# Patient Record
Sex: Female | Born: 1965 | Race: White | Hispanic: No | Marital: Single | Smoking: Never smoker
Health system: Southern US, Community
[De-identification: ages and names within clinical notes are randomized; demographics above are authoritative.]

## PROBLEM LIST (undated history)

## (undated) DIAGNOSIS — I471 Supraventricular tachycardia, unspecified: Secondary | ICD-10-CM

## (undated) DIAGNOSIS — R768 Other specified abnormal immunological findings in serum: Secondary | ICD-10-CM

## (undated) DIAGNOSIS — M1990 Unspecified osteoarthritis, unspecified site: Secondary | ICD-10-CM

## (undated) DIAGNOSIS — I341 Nonrheumatic mitral (valve) prolapse: Secondary | ICD-10-CM

## (undated) DIAGNOSIS — R000 Tachycardia, unspecified: Secondary | ICD-10-CM

## (undated) DIAGNOSIS — I10 Essential (primary) hypertension: Secondary | ICD-10-CM

## (undated) DIAGNOSIS — A4902 Methicillin resistant Staphylococcus aureus infection, unspecified site: Secondary | ICD-10-CM

## (undated) DIAGNOSIS — R7689 Other specified abnormal immunological findings in serum: Secondary | ICD-10-CM

## (undated) DIAGNOSIS — I499 Cardiac arrhythmia, unspecified: Secondary | ICD-10-CM

---

## 2006-04-28 ENCOUNTER — Emergency Department: Payer: Self-pay | Admitting: Unknown Physician Specialty

## 2008-01-15 ENCOUNTER — Emergency Department: Payer: Self-pay | Admitting: Emergency Medicine

## 2008-04-05 ENCOUNTER — Emergency Department: Payer: Self-pay | Admitting: Internal Medicine

## 2009-01-13 ENCOUNTER — Emergency Department: Payer: Self-pay | Admitting: Emergency Medicine

## 2012-03-12 ENCOUNTER — Emergency Department: Payer: Self-pay | Admitting: *Deleted

## 2012-03-12 LAB — URINALYSIS, COMPLETE
Bilirubin,UR: NEGATIVE
Leukocyte Esterase: NEGATIVE
Ph: 6 (ref 4.5–8.0)
Protein: NEGATIVE
RBC,UR: 11 /HPF (ref 0–5)
Specific Gravity: 1.021 (ref 1.003–1.030)
Squamous Epithelial: 1
WBC UR: 1 /HPF (ref 0–5)

## 2012-03-12 LAB — CBC
HGB: 12.1 g/dL (ref 12.0–16.0)
MCH: 27.6 pg (ref 26.0–34.0)
MCHC: 32.5 g/dL (ref 32.0–36.0)
Platelet: 290 10*3/uL (ref 150–440)
RDW: 14 % (ref 11.5–14.5)

## 2012-03-12 LAB — COMPREHENSIVE METABOLIC PANEL
Albumin: 3.7 g/dL (ref 3.4–5.0)
Alkaline Phosphatase: 61 U/L (ref 50–136)
Anion Gap: 8 (ref 7–16)
BUN: 14 mg/dL (ref 7–18)
Calcium, Total: 8.2 mg/dL — ABNORMAL LOW (ref 8.5–10.1)
EGFR (African American): >60
Glucose: 87 mg/dL (ref 65–99)
Osmolality: 277 (ref 275–301)
Potassium: 3.5 mmol/L (ref 3.5–5.1)
Sodium: 139 mmol/L (ref 136–145)

## 2012-03-12 LAB — TROPONIN I: Troponin-I: <0.02 ng/mL

## 2013-06-18 ENCOUNTER — Ambulatory Visit: Payer: Self-pay

## 2014-11-18 ENCOUNTER — Ambulatory Visit: Payer: Self-pay

## 2014-12-28 ENCOUNTER — Emergency Department
Admit: 2014-12-28 | Disposition: A | Discharge status: Home / Self Care | Payer: Self-pay | Admitting: Emergency Medicine

## 2015-07-06 ENCOUNTER — Ambulatory Visit
Admission: EM | Admit: 2015-07-06 | Discharge: 2015-07-06 | Disposition: A | Discharge status: Home / Self Care | Payer: BLUE CROSS/BLUE SHIELD | Attending: Family Medicine | Admitting: Family Medicine

## 2015-07-06 ENCOUNTER — Encounter: Payer: Self-pay | Admitting: Gynecology

## 2015-07-06 DIAGNOSIS — L02411 Cutaneous abscess of right axilla: Secondary | ICD-10-CM | POA: Diagnosis not present

## 2015-07-06 HISTORY — DX: Tachycardia, unspecified: R00.0

## 2015-07-06 HISTORY — DX: Methicillin resistant Staphylococcus aureus infection, unspecified site: A49.02

## 2015-07-06 HISTORY — DX: Nonrheumatic mitral (valve) prolapse: I34.1

## 2015-07-06 MED ORDER — SULFAMETHOXAZOLE-TRIMETHOPRIM 800-160 MG PO TABS: 1.0000 | ORAL_TABLET | Freq: Two times a day (BID) | ORAL | Status: DC

## 2015-07-06 NOTE — ED Provider Notes (Signed)
CSN: [id number]     Arrival date & time 07/06/15  1043 History   First MD Initiated Contact with Patient 07/06/15 1119     Chief Complaint  Patient presents with  . Cyst   (Consider location/radiation/quality/duration/timing/severity/associated sxs/prior Treatment) HPI Comments: 49 yo female with a 5 days h/o progressively worsening right axilla skin lump. States last night she noticed a very slight amount of pus drain out.   The history is provided by the patient.    Past Medical History  Diagnosis Date  . Mitral valve prolapse   . Tachycardia    History reviewed. No pertinent past surgical history. No family history on file. Social History  Substance Use Topics  . Smoking status: Never Smoker   . Smokeless tobacco: None  . Alcohol Use: No   OB History    No data available     Review of Systems  Allergies  Codeine  Home Medications   Prior to Admission medications   Medication Sig Start Date End Date Taking? Authorizing Provider  atenolol (TENORMIN) 50 MG tablet Take 50 mg by mouth daily.   Yes Historical Provider, MD  sulfamethoxazole-trimethoprim (BACTRIM DS,SEPTRA DS) 800-160 MG tablet Take 1 tablet by mouth 2 (two) times daily. 07/06/15   Payton Mccallumrlando Gladie Gravette, MD   Meds Ordered and Administered this Visit  Medications - No data to display  BP 121/63 mmHg  Pulse 76  Temp(Src) 98.9 F (37.2 C) (Oral)  Resp 16  Ht 5\' 4"  (1.626 m)  Wt 170 lb (77.111 kg)  BMI 29.17 kg/m2  SpO2 100%  LMP 05/31/2015 No data found.   Physical Exam  Constitutional: She appears well-developed and well-nourished. No distress.  Skin: She is not diaphoretic.  4x2cm erythematous, fluctuant and tender subcutanesous lump with white appearing point in the middle on the right axillary skin  Nursing note and vitals reviewed.   ED Course  Procedures (including critical care time)  Labs Review Labs Reviewed  CULTURE, ROUTINE-ABSCESS    Imaging Review No results found.   Visual  Acuity Review  Right Eye Distance:   Left Eye Distance:   Bilateral Distance:    Right Eye Near:   Left Eye Near:    Bilateral Near:         MDM   1. Abscess of axilla, right    Discharge Medication List as of 07/06/2015 11:53 AM    START taking these medications   Details  sulfamethoxazole-trimethoprim (BACTRIM DS,SEPTRA DS) 800-160 MG tablet Take 1 tablet by mouth 2 (two) times daily., Starting 07/06/2015, Until Discontinued, Normal      1.diagnosis reviewed with patient; discussed I&D procedure and patient states she would only like to have the surface nicked to open but not deeper and would not like injection of lidocaine or antibiotic; surface cleaned and skin surface nicked with 11 blade with expression/drainage of pus; obtained sample for culture; patient tolerated well. 2. rx as per orders above; reviewed possible side effects, interactions, risks and benefits  3. Recommend supportive treatment with warm compresses to area 4. Follow up prn if symptoms worsen or don't improve   Payton Mccallumrlando Alanys Godino, MD 07/06/15 [phone]

## 2015-07-06 NOTE — ED Notes (Signed)
Patient c/o painful lump under right axillary x 5 days.

## 2015-07-08 ENCOUNTER — Telehealth: Payer: Self-pay

## 2015-07-08 NOTE — ED Notes (Signed)
Lab call (+) MRSA. Pt currently prescribed Bactrim.

## 2015-07-09 LAB — CULTURE, ROUTINE-ABSCESS: Special Requests: NORMAL

## 2015-07-11 NOTE — ED Notes (Signed)
MRSA positive abscess treated w sulfa based Rx; treatment adequate w Rx provided day of visit. Left message for patient to be rechecked if she is not improving and/or if she has drain that is to be removed.

## 2015-07-14 NOTE — ED Notes (Signed)
Received message, called patient and left message to call if she continues to have questions

## 2016-05-10 DIAGNOSIS — I341 Nonrheumatic mitral (valve) prolapse: Secondary | ICD-10-CM | POA: Insufficient documentation

## 2016-05-10 DIAGNOSIS — I471 Supraventricular tachycardia: Secondary | ICD-10-CM | POA: Insufficient documentation

## 2016-07-22 ENCOUNTER — Other Ambulatory Visit: Payer: Self-pay | Admitting: Medical Oncology

## 2016-07-22 ENCOUNTER — Telehealth: Payer: Self-pay | Admitting: Surgery

## 2016-07-22 DIAGNOSIS — R599 Enlarged lymph nodes, unspecified: Secondary | ICD-10-CM

## 2016-07-22 NOTE — Telephone Encounter (Signed)
Left voice message for patient to call schedule appointment for enlarged lymph node. [facility]

## 2016-07-26 ENCOUNTER — Other Ambulatory Visit: Payer: Self-pay | Admitting: Medical Oncology

## 2016-07-26 DIAGNOSIS — R599 Enlarged lymph nodes, unspecified: Secondary | ICD-10-CM

## 2016-07-27 ENCOUNTER — Other Ambulatory Visit: Payer: Self-pay

## 2016-07-27 ENCOUNTER — Ambulatory Visit
Admission: RE | Admit: 2016-07-27 | Discharge: 2016-07-27 | Disposition: A | Discharge status: Home / Self Care | Payer: BLUE CROSS/BLUE SHIELD | Source: Ambulatory Visit | Attending: Medical Oncology | Admitting: Medical Oncology

## 2016-07-27 DIAGNOSIS — R599 Enlarged lymph nodes, unspecified: Secondary | ICD-10-CM | POA: Diagnosis not present

## 2016-07-29 ENCOUNTER — Telehealth: Payer: Self-pay | Admitting: *Deleted

## 2016-07-29 NOTE — Telephone Encounter (Signed)
Patient called and stated [facility] and [facility] was [facility]. She was coming in to see Dr. Everlene FarrierPabon today . Patient is unsure if she still needs to see him if her Ultrasound results was normal and she didn't need to f/u with her dr there for 3-6 months . Patient states you can call her if she still needs to come in and see the Surgeon. Her contact number is [phone]. Please advise provider. Thank you

## 2016-07-30 ENCOUNTER — Ambulatory Visit: Payer: BLUE CROSS/BLUE SHIELD | Admitting: Surgery

## 2016-08-04 ENCOUNTER — Other Ambulatory Visit: Payer: Self-pay | Admitting: Medical Oncology

## 2016-08-04 DIAGNOSIS — R590 Localized enlarged lymph nodes: Secondary | ICD-10-CM

## 2016-12-15 ENCOUNTER — Other Ambulatory Visit: Payer: Self-pay | Admitting: Medical Oncology

## 2016-12-15 DIAGNOSIS — Z1231 Encounter for screening mammogram for malignant neoplasm of breast: Secondary | ICD-10-CM

## 2017-01-10 ENCOUNTER — Ambulatory Visit
Admission: RE | Admit: 2017-01-10 | Discharge: 2017-01-10 | Disposition: A | Discharge status: Home / Self Care | Payer: BLUE CROSS/BLUE SHIELD | Source: Ambulatory Visit | Attending: Medical Oncology | Admitting: Medical Oncology

## 2017-01-10 DIAGNOSIS — Z1231 Encounter for screening mammogram for malignant neoplasm of breast: Secondary | ICD-10-CM | POA: Insufficient documentation

## 2017-01-10 DIAGNOSIS — R928 Other abnormal and inconclusive findings on diagnostic imaging of breast: Secondary | ICD-10-CM | POA: Diagnosis not present

## 2017-01-14 ENCOUNTER — Other Ambulatory Visit: Payer: Self-pay | Admitting: Medical Oncology

## 2017-01-14 DIAGNOSIS — R928 Other abnormal and inconclusive findings on diagnostic imaging of breast: Secondary | ICD-10-CM

## 2017-01-14 DIAGNOSIS — N6489 Other specified disorders of breast: Secondary | ICD-10-CM

## 2017-01-20 ENCOUNTER — Ambulatory Visit
Admission: RE | Admit: 2017-01-20 | Discharge: 2017-01-20 | Disposition: A | Discharge status: Home / Self Care | Payer: BLUE CROSS/BLUE SHIELD | Source: Ambulatory Visit | Attending: Medical Oncology | Admitting: Medical Oncology

## 2017-01-20 DIAGNOSIS — R928 Other abnormal and inconclusive findings on diagnostic imaging of breast: Secondary | ICD-10-CM

## 2017-01-20 DIAGNOSIS — N6489 Other specified disorders of breast: Secondary | ICD-10-CM | POA: Diagnosis present

## 2017-06-10 ENCOUNTER — Other Ambulatory Visit: Payer: Self-pay | Admitting: Medical Oncology

## 2017-06-10 DIAGNOSIS — R928 Other abnormal and inconclusive findings on diagnostic imaging of breast: Secondary | ICD-10-CM

## 2017-07-25 ENCOUNTER — Ambulatory Visit
Admission: RE | Admit: 2017-07-25 | Discharge: 2017-07-25 | Disposition: A | Discharge status: Home / Self Care | Payer: BLUE CROSS/BLUE SHIELD | Source: Ambulatory Visit | Attending: Medical Oncology | Admitting: Medical Oncology

## 2017-07-25 DIAGNOSIS — R928 Other abnormal and inconclusive findings on diagnostic imaging of breast: Secondary | ICD-10-CM | POA: Insufficient documentation

## 2017-08-10 ENCOUNTER — Encounter: Payer: Self-pay | Admitting: *Deleted

## 2017-08-15 ENCOUNTER — Encounter: Payer: Self-pay | Admitting: *Deleted

## 2017-08-15 ENCOUNTER — Ambulatory Visit
Admission: RE | Admit: 2017-08-15 | Discharge: 2017-08-15 | Disposition: A | Discharge status: Home / Self Care | Payer: BLUE CROSS/BLUE SHIELD | Source: Ambulatory Visit | Attending: Gastroenterology | Admitting: Gastroenterology

## 2017-08-15 ENCOUNTER — Ambulatory Visit: Payer: BLUE CROSS/BLUE SHIELD | Admitting: Anesthesiology

## 2017-08-15 ENCOUNTER — Encounter
Admission: RE | Disposition: A | Discharge status: Home / Self Care | Payer: Self-pay | Source: Ambulatory Visit | Attending: Gastroenterology

## 2017-08-15 DIAGNOSIS — Z8614 Personal history of Methicillin resistant Staphylococcus aureus infection: Secondary | ICD-10-CM | POA: Insufficient documentation

## 2017-08-15 DIAGNOSIS — Z1211 Encounter for screening for malignant neoplasm of colon: Secondary | ICD-10-CM | POA: Insufficient documentation

## 2017-08-15 DIAGNOSIS — I471 Supraventricular tachycardia: Secondary | ICD-10-CM | POA: Insufficient documentation

## 2017-08-15 DIAGNOSIS — I341 Nonrheumatic mitral (valve) prolapse: Secondary | ICD-10-CM | POA: Diagnosis not present

## 2017-08-15 DIAGNOSIS — Z800 Family history of malignant neoplasm of digestive organs: Secondary | ICD-10-CM | POA: Insufficient documentation

## 2017-08-15 DIAGNOSIS — Z8371 Family history of colonic polyps: Secondary | ICD-10-CM | POA: Diagnosis not present

## 2017-08-15 DIAGNOSIS — K635 Polyp of colon: Secondary | ICD-10-CM | POA: Insufficient documentation

## 2017-08-15 DIAGNOSIS — K648 Other hemorrhoids: Secondary | ICD-10-CM | POA: Diagnosis not present

## 2017-08-15 HISTORY — DX: Cardiac arrhythmia, unspecified: I49.9

## 2017-08-15 HISTORY — PX: COLONOSCOPY WITH PROPOFOL: SHX5780

## 2017-08-15 LAB — POCT PREGNANCY, URINE: PREG TEST UR: NEGATIVE

## 2017-08-15 SURGERY — COLONOSCOPY WITH PROPOFOL
Anesthesia: General

## 2017-08-15 MED ORDER — MIDAZOLAM HCL 2 MG/2ML IJ SOLN
INTRAMUSCULAR | Status: DC | PRN
  Administered 2017-08-15: 2 mg via INTRAVENOUS

## 2017-08-15 MED ORDER — SODIUM CHLORIDE 0.9 % IV SOLN: INTRAVENOUS | Status: DC

## 2017-08-15 MED ORDER — SODIUM CHLORIDE 0.9 % IV SOLN
INTRAVENOUS | Status: DC
  Administered 2017-08-15: 1000 mL via INTRAVENOUS

## 2017-08-15 MED ORDER — FENTANYL CITRATE (PF) 100 MCG/2ML IJ SOLN
INTRAMUSCULAR | Status: AC
  Filled 2017-08-15: qty 2

## 2017-08-15 MED ORDER — FENTANYL CITRATE (PF) 100 MCG/2ML IJ SOLN
INTRAMUSCULAR | Status: DC | PRN
  Administered 2017-08-15: 25 ug via INTRAVENOUS
  Administered 2017-08-15: 50 ug via INTRAVENOUS
  Administered 2017-08-15: 25 ug via INTRAVENOUS

## 2017-08-15 MED ORDER — PROPOFOL 500 MG/50ML IV EMUL
INTRAVENOUS | Status: AC
  Filled 2017-08-15: qty 50

## 2017-08-15 MED ORDER — PROPOFOL 10 MG/ML IV BOLUS
INTRAVENOUS | Status: AC
  Filled 2017-08-15: qty 20

## 2017-08-15 MED ORDER — MIDAZOLAM HCL 2 MG/2ML IJ SOLN
INTRAMUSCULAR | Status: AC
  Filled 2017-08-15: qty 2

## 2017-08-15 MED ORDER — PROPOFOL 500 MG/50ML IV EMUL
INTRAVENOUS | Status: DC | PRN
  Administered 2017-08-15: 120 ug/kg/min via INTRAVENOUS

## 2017-08-15 NOTE — H&P (Signed)
Outpatient short stay form Pre-procedure 08/15/2017 3:48 PM Carrie Nichols U Carrie Montini MD  Primary Physician: Porfirio OarSarah Michelle Nelson PA  Reason for visit:  Colonoscopy  History of present illness:  Patient is a 51 year old female presenting today as above. There is a family history of colon cancer in multiple secondary relatives and colon polyps and a primary relative. She tolerated her prep well. She takes no aspirin or blood thinning agents. Her last colonoscopy was over 10 years ago.    Current Facility-Administered Medications:  .  0.9 %  sodium chloride infusion, , Intravenous, Continuous, Carrie DeemSkulskie, Terryl Niziolek U, MD, Last Rate: 20 mL/hr at 08/15/17 1326, 1,000 mL at 08/15/17 1326 .  0.9 %  sodium chloride infusion, , Intravenous, Continuous, Carrie DeemSkulskie, Blair Lundeen U, MD .  0.9 %  sodium chloride infusion, , Intravenous, Continuous, Carrie DeemSkulskie, Shemar Plemmons U, MD  Medications Prior to Admission  Medication Sig Dispense Refill Last Dose  . ibuprofen (ADVIL,MOTRIN) 200 MG tablet Take 400 mg by mouth as needed.   Past Week at Unknown time  . metoprolol succinate (TOPROL-XL) 25 MG 24 hr tablet Take 1 tablet by mouth 1 day or 1 dose.   08/15/2017 at 700     Allergies  Allergen Reactions  . Codeine Rash     Past Medical History:  Diagnosis Date  . Dysrhythmia    SVT  . Mitral valve prolapse   . MRSA infection   . Tachycardia     Review of systems:      Physical Exam    Heart and lungs: Regular rate and rhythm without rub or gallop, lungs are bilaterally clear    HEENT: Normocephalic atraumatic eyes are anicteric    Other:     Pertinant exam for procedure: Soft nontender nondistended bowel sounds positive normoactive.    Planned proceedures: Colonoscopy and indicated procedures. I have discussed the risks benefits and complications of procedures to include not limited to bleeding, infection, perforation and the risk of sedation and the patient wishes to proceed.    Carrie Nichols U Mikella Linsley,  MD Gastroenterology 08/15/2017  3:48 PM

## 2017-08-15 NOTE — Op Note (Signed)
[facility] Gastroenterology Patient Name: Carrie Nichols Procedure Date: 08/15/2017 3:55 PM MRN: [id number] Account #: [id number] Date of Birth: Jan 21, 1966 Admit Type: Outpatient Age: 51 [facility] ROOM 1 Gender: Female Note Status: Finalized Procedure:            Colonoscopy Indications:          Family history of colon cancer in multiple                        second-degree relatives, Family history of colonic                        polyps in a first-degree relative Providers:            Christena DeemMartin U. Dalan Cowger, MD Referring MD:         Lynett GrimesSarah M. Nelson (Referring MD) Medicines:            Monitored Anesthesia Care Complications:        No immediate complications. Procedure:            Pre-Anesthesia Assessment:                       - ASA Grade Assessment: II - A patient with mild                        systemic disease.                       After obtaining informed consent, the colonoscope was                        passed under direct vision. Throughout the procedure,                        the patient's blood pressure, pulse, and oxygen                        saturations were monitored continuously. The                        Colonoscope was introduced through the anus and                        advanced to the the cecum, identified by appendiceal                        orifice and ileocecal valve. The colonoscopy was                        unusually difficult due to poor bowel prep. Successful                        completion of the procedure was aided by lavage. The                        quality of the bowel preparation was good except the                        ascending colon was poor. Findings:      Six sessile polyps were found in the recto-sigmoid colon. The polyps  were 1 to 3 mm in size.      The exam was otherwise normal throughout the examined colon.      Retroflexion in the rectum was attempted though not completed due to   narrowness of the vault, however multiple passes showed no rectal       abnormality except small internal hemorrhoids.      The digital rectal exam was normal. Impression:           - Six 1 to 3 mm polyps at the recto-sigmoid colon.                       - No specimens collected. Recommendation:       - Discharge patient to home.                       - Await pathology results.                       - Telephone GI clinic for pathology results in 1 week. Procedure Code(s):    --- Professional ---                       [phone], Colonoscopy, flexible; diagnostic, including                        collection of specimen(s) by brushing or washing, when                        performed (separate procedure) Diagnosis Code(s):    --- Professional ---                       D12.7, Benign neoplasm of rectosigmoid junction                       Z80.0, Family history of malignant neoplasm of                        digestive organs                       Z83.71, Family history of colonic polyps CPT copyright 2016 American [employer]. All rights reserved. The codes documented in this report are preliminary and upon coder review may  be revised to meet current compliance requirements. Christena DeemMartin U Rosalee Tolley, MD 08/15/2017 4:32:31 PM This report has been signed electronically. Number of Addenda: 0 Note Initiated On: 08/15/2017 3:55 PM Scope Withdrawal Time: 0 hours 14 minutes 46 seconds  Total Procedure Duration: 0 hours 23 minutes 57 seconds       [facility]

## 2017-08-15 NOTE — Anesthesia Procedure Notes (Signed)
Performed by: Cook-Martin, Inika Bellanger Pre-anesthesia Checklist: Patient identified, Emergency Drugs available, Suction available, Patient being monitored and Timeout performed Patient Re-evaluated:Patient Re-evaluated prior to induction Oxygen Delivery Method: Nasal cannula Preoxygenation: Pre-oxygenation with 100% oxygen Induction Type: IV induction Placement Confirmation: positive ETCO2 and CO2 detector       

## 2017-08-15 NOTE — Transfer of Care (Signed)
Immediate Anesthesia Transfer of Care Note  Patient: Carrie Nichols  Procedure(s) Performed: COLONOSCOPY WITH PROPOFOL (N/A )  Patient Location: PACU  Anesthesia Type:General  Level of Consciousness: awake and sedated  Airway & Oxygen Therapy: Patient Spontanous Breathing and Patient connected to nasal cannula oxygen  Post-op Assessment: Report given to RN and Post -op Vital signs reviewed and stable  Post vital signs: stable Last Vitals:  Vitals:   08/15/17 1304  BP: 138/74  Pulse: 80  Resp: 20  Temp: 36.9 C  SpO2: 99%    Last Pain:  Vitals:   08/15/17 1304  TempSrc: Tympanic      Patients Stated Pain Goal: 0 (08/15/17 1304)  Complications: No apparent anesthesia complications

## 2017-08-15 NOTE — Anesthesia Post-op Follow-up Note (Signed)
Anesthesia QCDR form completed.        

## 2017-08-15 NOTE — Anesthesia Preprocedure Evaluation (Signed)
Anesthesia Evaluation    Reviewed: Allergy & Precautions, NPO status , Patient's Chart, lab work & pertinent test results, reviewed documented beta blocker date and time   History of Anesthesia Complications Negative for: history of anesthetic complications  Airway Mallampati: II       Dental   Pulmonary neg sleep apnea, neg COPD,           Cardiovascular + dysrhythmias Supra Ventricular Tachycardia + Valvular Problems/Murmurs MVP      Neuro/Psych neg Seizures    GI/Hepatic Neg liver ROS, neg GERD  ,  Endo/Other  neg diabetes  Renal/GU negative Renal ROS     Musculoskeletal   Abdominal   Peds  Hematology   Anesthesia Other Findings   Reproductive/Obstetrics                             Anesthesia Physical Anesthesia Plan  ASA: II  Anesthesia Plan: General   Post-op Pain Management:    Induction:   PONV Risk Score and Plan: 2 and Ondansetron, Propofol infusion and TIVA  Airway Management Planned: Nasal Cannula  Additional Equipment:   Intra-op Plan:   Post-operative Plan:   Informed Consent: I have reviewed the patients History and Physical, chart, labs and discussed the procedure including the risks, benefits and alternatives for the proposed anesthesia with the patient or authorized representative who has indicated his/her understanding and acceptance.     Plan Discussed with:   Anesthesia Plan Comments:         Anesthesia Quick Evaluation

## 2017-08-15 NOTE — Anesthesia Postprocedure Evaluation (Signed)
Anesthesia Post Note  Patient: Carrie HugueninCarolyn Faye Nichols  Procedure(s) Performed: COLONOSCOPY WITH PROPOFOL (N/A )  Patient location during evaluation: Endoscopy Anesthesia Type: General Level of consciousness: awake and alert Pain management: pain level controlled Vital Signs Assessment: post-procedure vital signs reviewed and stable Respiratory status: spontaneous breathing and respiratory function stable Cardiovascular status: stable Anesthetic complications: no     Last Vitals:  Vitals:   08/15/17 1304 08/15/17 1633  BP: 138/74 113/72  Pulse: 80   Resp: 20   Temp: 36.9 C (!) 36.1 C  SpO2: 99%     Last Pain:  Vitals:   08/15/17 1633  TempSrc: Tympanic                 Zayn Selley K

## 2017-08-16 ENCOUNTER — Encounter: Payer: Self-pay | Admitting: Gastroenterology

## 2017-08-18 LAB — SURGICAL PATHOLOGY

## 2018-01-18 ENCOUNTER — Other Ambulatory Visit: Payer: Self-pay | Admitting: Medical Oncology

## 2018-01-18 DIAGNOSIS — R928 Other abnormal and inconclusive findings on diagnostic imaging of breast: Secondary | ICD-10-CM

## 2018-02-16 ENCOUNTER — Ambulatory Visit
Admission: RE | Admit: 2018-02-16 | Discharge: 2018-02-16 | Disposition: A | Discharge status: Home / Self Care | Payer: BLUE CROSS/BLUE SHIELD | Source: Ambulatory Visit | Attending: Medical Oncology | Admitting: Medical Oncology

## 2018-02-16 DIAGNOSIS — R928 Other abnormal and inconclusive findings on diagnostic imaging of breast: Secondary | ICD-10-CM

## 2018-07-04 IMAGING — MG MM DIGITAL SCREENING BILAT W/ CAD
8 series · 8 of 8 positions shown · non-contrast
Comparison: Previous exam(s).

CLINICAL DATA: Screening.

EXAM:
DIGITAL SCREENING BILATERAL MAMMOGRAM WITH CAD

[R MLO (1 of 2)]
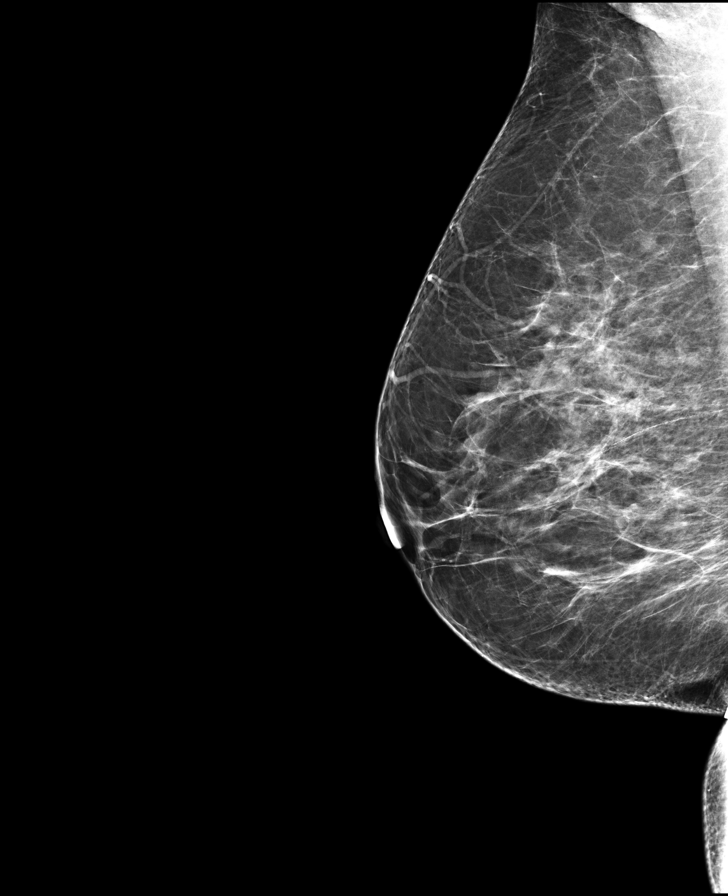

[R XCCL]
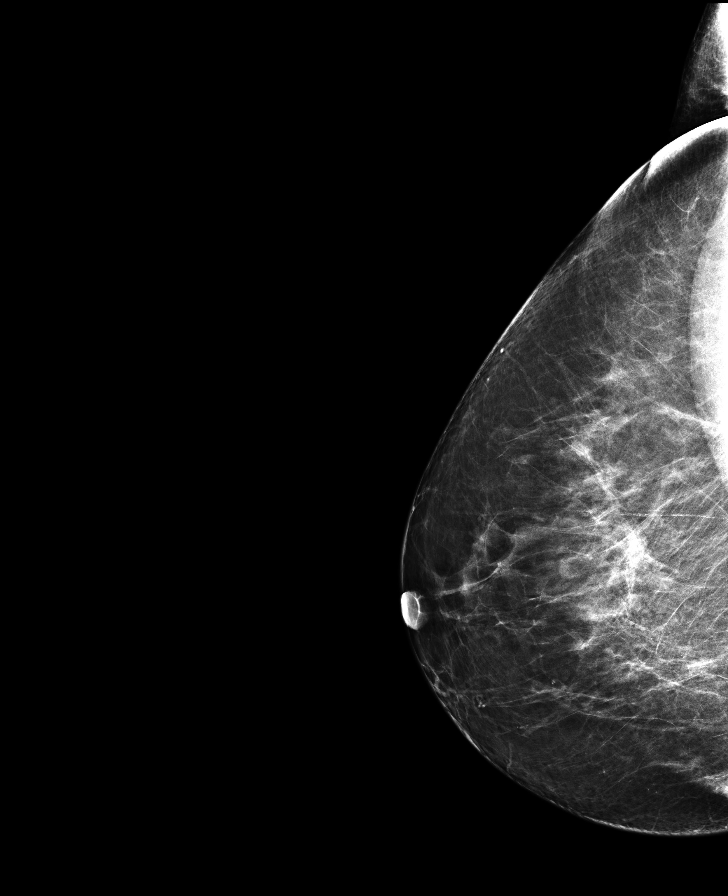

[L MLO (1 of 2)]
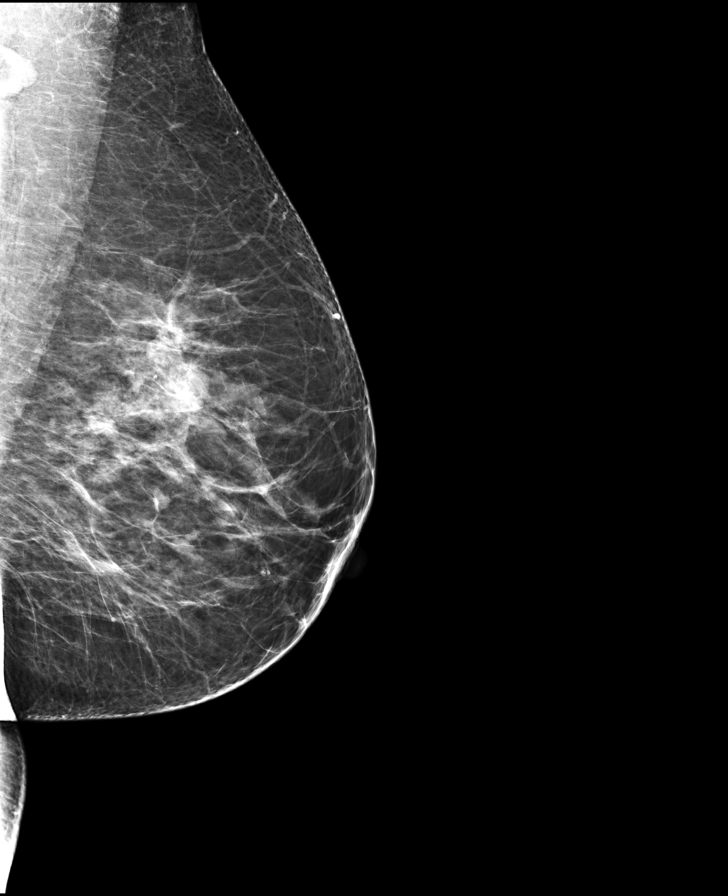

[L CC (1 of 2)]
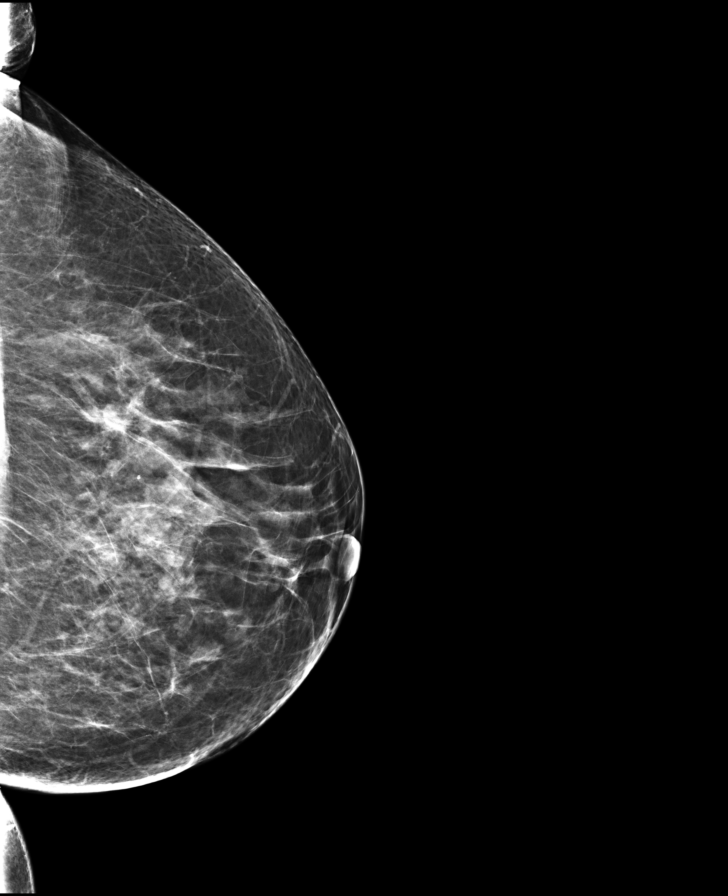

[R CC]
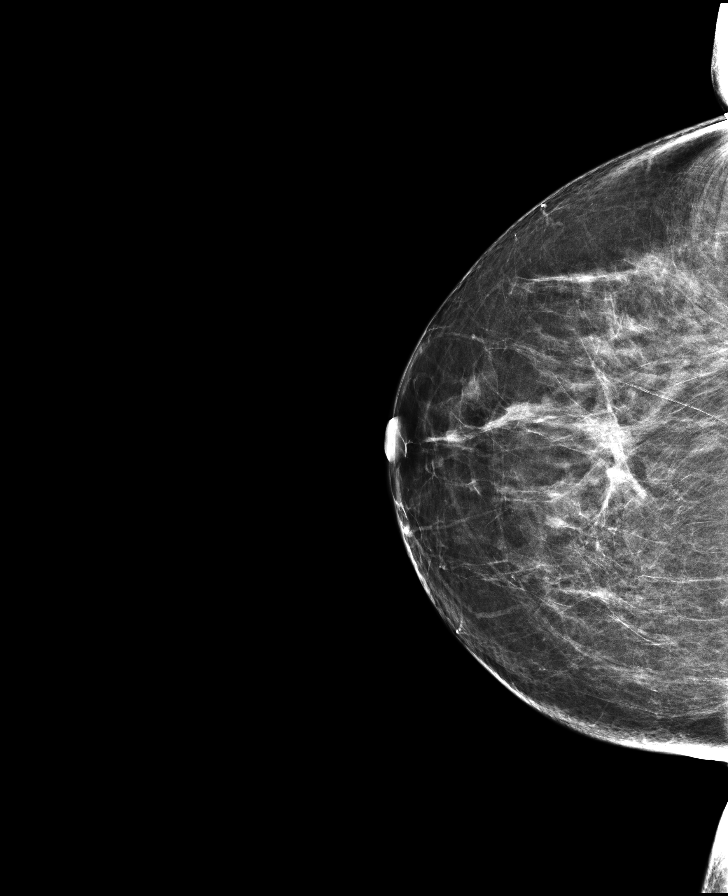

[R MLO (2 of 2)]
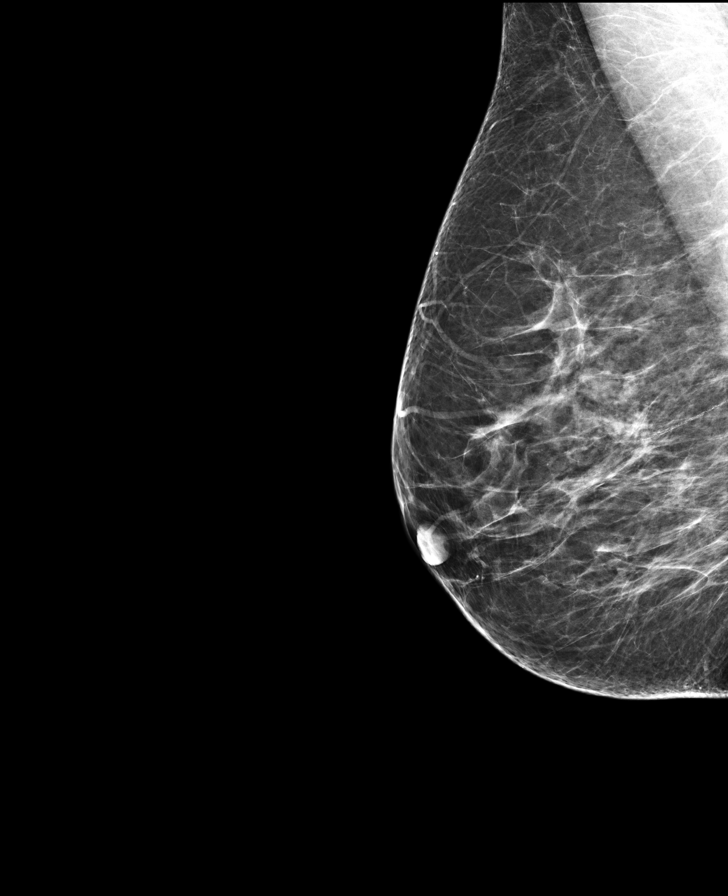

[L CC (2 of 2)]
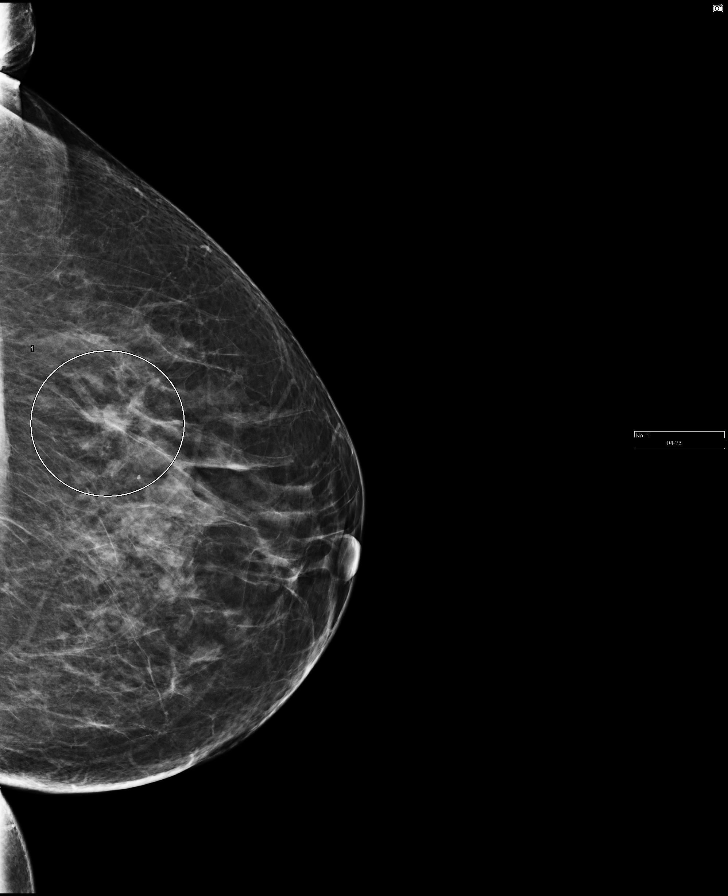

[L MLO (2 of 2)]
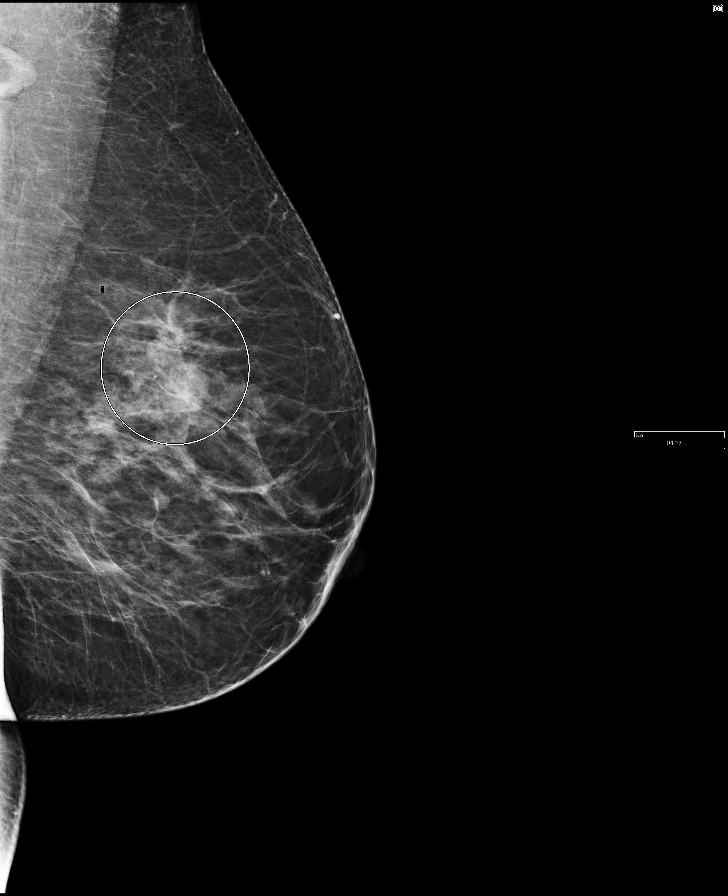

[8 of 8 positions shown; findings below may reference images not displayed]

ACR Breast Density Category c: The breast tissue is heterogeneously
dense, which may obscure small masses.
FINDINGS: In the left breast, a possible asymmetry warrants further
evaluation. In the right breast, no findings suspicious for
malignancy. Images were processed with CAD.
IMPRESSION: Further evaluation is suggested for possible asymmetry in the left
breast.

RECOMMENDATION:
Diagnostic mammogram and possibly ultrasound of the left breast.
(Code:P3-X-WW1)

The patient will be contacted regarding the findings, and additional
imaging will be scheduled.

BI-RADS CATEGORY  0: Incomplete. Need additional imaging evaluation
and/or prior mammograms for comparison.

## 2018-11-17 ENCOUNTER — Ambulatory Visit: Payer: BLUE CROSS/BLUE SHIELD | Admitting: Podiatry

## 2018-11-27 ENCOUNTER — Encounter: Payer: Self-pay | Admitting: Emergency Medicine

## 2018-11-27 ENCOUNTER — Emergency Department: Payer: BLUE CROSS/BLUE SHIELD

## 2018-11-27 ENCOUNTER — Emergency Department
Admission: EM | Admit: 2018-11-27 | Discharge: 2018-11-27 | Disposition: A | Discharge status: Home / Self Care | Payer: BLUE CROSS/BLUE SHIELD | Attending: Emergency Medicine | Admitting: Emergency Medicine

## 2018-11-27 ENCOUNTER — Other Ambulatory Visit: Payer: Self-pay

## 2018-11-27 DIAGNOSIS — S93691A Other sprain of right foot, initial encounter: Secondary | ICD-10-CM | POA: Insufficient documentation

## 2018-11-27 DIAGNOSIS — S99921A Unspecified injury of right foot, initial encounter: Secondary | ICD-10-CM | POA: Diagnosis present

## 2018-11-27 DIAGNOSIS — S93601A Unspecified sprain of right foot, initial encounter: Secondary | ICD-10-CM

## 2018-11-27 DIAGNOSIS — W010XXA Fall on same level from slipping, tripping and stumbling without subsequent striking against object, initial encounter: Secondary | ICD-10-CM | POA: Insufficient documentation

## 2018-11-27 DIAGNOSIS — Y929 Unspecified place or not applicable: Secondary | ICD-10-CM | POA: Insufficient documentation

## 2018-11-27 DIAGNOSIS — Y9302 Activity, running: Secondary | ICD-10-CM | POA: Diagnosis not present

## 2018-11-27 DIAGNOSIS — Y999 Unspecified external cause status: Secondary | ICD-10-CM | POA: Diagnosis not present

## 2018-11-27 NOTE — ED Triage Notes (Signed)
Pt states she was being chased by a rooster this afternoon,  fell and twisted her right foot, pain on the top, walked with limp to triage.

## 2018-11-27 NOTE — ED Provider Notes (Signed)
[facility] Emergency Department Provider Note ____________________________________________  Time seen: 1829  I have reviewed the triage vital signs and the nursing notes.  HISTORY  Chief Complaint  Fall and Foot Pain  HPI Carrie Nichols is a 53 y.o. female presents to the ED for evaluation of right foot pain.  Patient describes afternoon she was being chased by rooster, when she ran and fell after she twisted her right foot.  She presents now with dorsolateral pain to the right foot.  She denies any other injury at this time.  Past Medical History:  Diagnosis Date  . Dysrhythmia    SVT  . Mitral valve prolapse   . MRSA infection   . Tachycardia     Patient Active Problem List   Diagnosis Date Noted  . Mitral valve prolapse 05/10/2016  . SVT (supraventricular tachycardia) (HCC) 05/10/2016    Past Surgical History:  Procedure Laterality Date  . COLONOSCOPY WITH PROPOFOL N/A 08/15/2017   Procedure: COLONOSCOPY WITH PROPOFOL;  Surgeon: Christena Deem, MD;  Location: [facility]COPY;  Service: Endoscopy;  Laterality: N/A;    Prior to Admission medications   Medication Sig Start Date End Date Taking? Authorizing Provider  ibuprofen (ADVIL,MOTRIN) 200 MG tablet Take 400 mg by mouth as needed.    [provider]  metoprolol succinate (TOPROL-XL) 25 MG 24 hr tablet Take 1 tablet by mouth 1 day or 1 dose. 07/21/16 08/19/17  [provider]    Allergies Codeine  Family History  Problem Relation Age of Onset  . Hyperlipidemia Mother   . High blood pressure Mother   . Skin cancer Father   . Colon polyps Father   . Hyperlipidemia Brother   . Ovarian cancer Paternal Grandmother   . Prostate cancer Paternal Grandfather   . Colon cancer Paternal Uncle   . Breast cancer Neg Hx     Social History Social History   Tobacco Use  . Smoking status: Never Smoker  . Smokeless tobacco: Never Used  Substance Use Topics  . Alcohol  use: No  . Drug use: No    Review of Systems  Constitutional: Negative for fever. Cardiovascular: Negative for chest pain. Respiratory: Negative for shortness of breath. Musculoskeletal: Negative for back pain. Right foot pain  Skin: Negative for rash. Neurological: Negative for headaches, focal weakness or numbness. ____________________________________________  PHYSICAL EXAM:  VITAL SIGNS: ED Triage Vitals  Enc Vitals Group     BP 11/27/18 1813 128/65     Pulse Rate 11/27/18 1813 80     Resp --      Temp 11/27/18 1813 97.8 F (36.6 C)     Temp Source 11/27/18 1813 Oral     SpO2 11/27/18 1813 98 %     Weight 11/27/18 1810 176 lb (79.8 kg)     Height 11/27/18 1810 5\' 4"  (1.626 m)     Head Circumference --      Peak Flow --      Pain Score 11/27/18 1809 6     Pain Loc --      Pain Edu? --      Excl. in GC? --     Constitutional: Alert and oriented. Well appearing and in no distress. Head: Normocephalic and atraumatic. Eyes: Conjunctivae are normal. Normal extraocular movements Cardiovascular: Normal rate, regular rhythm. Normal distal pulses. Respiratory: Normal respiratory effort. No wheezes/rales/rhonchi. Musculoskeletal: Right foot without obvious deformity, dislocation, or effusion.  Patient with some subtle soft tissue swelling over the dorsal  aspect of the foot.  She is tender to palpation to the base of the fifth meta tarsal.  Exam is benign showing full range of motion.  No significant calf or Achilles tenderness is elicited.  Negative anterior/posterior drawer sign.  Nontender with normal range of motion in all extremities.  Neurologic: Normal gross sensation. No gross focal neurologic deficits are appreciated. Skin:  Skin is warm, dry and intact. No rash noted. ____________________________________________   RADIOLOGY  Right Foot  IMPRESSION: 1. Chronic/stress fracture through the proximal fifth metatarsal. 2. No acute  fracture. ____________________________________________  PROCEDURES  Procedures Post-op Shoe ____________________________________________  INITIAL IMPRESSION / ASSESSMENT AND PLAN / ED COURSE  Patient with ED evaluation of acute right foot pain following mechanical fall.  Patient's x-ray was concerning as it showed no acute fracture, but a probable chronic/stress fracture to the base of the fifth metatarsal.  Patient denies any remote history of a foot injury or fracture.  It is consistent with the locale of her most intense pain.  Patient will be placed in a postop shoe for ambulation.  She is given instruction to take over-the-counter anti-inflammatories for additional symptom relief.  She is referred to podiatry for ongoing symptom management. ____________________________________________  FINAL CLINICAL IMPRESSION(S) / ED DIAGNOSES  Final diagnoses:  Sprain of right foot, initial encounter      Lissa Hoard, PA-C 11/27/18 1929    Phineas Semen, MD 11/27/18 2141

## 2018-11-27 NOTE — Discharge Instructions (Addendum)
Your exam and x-ray are consistent with a foot sprain. There is a question of a previous/chronic stress fracture to the base of the 5th toe. Wear the post-op shoe for comfort. Rest, ice, and elevate the foot when seated. Take OTC Ibuprofen or Naproxen for pain and inflammation. Follow-up with podiatry as needed.

## 2018-11-27 NOTE — ED Notes (Signed)
See triage note  States she was beng chased by a rooster  Fell  Twisted right foot  Unable to bear full wt  Good pulses

## 2019-02-19 ENCOUNTER — Other Ambulatory Visit: Payer: Self-pay | Admitting: Medical Oncology

## 2019-03-19 ENCOUNTER — Other Ambulatory Visit: Payer: Self-pay | Admitting: Pediatrics

## 2019-03-19 DIAGNOSIS — Z1239 Encounter for other screening for malignant neoplasm of breast: Secondary | ICD-10-CM

## 2019-03-27 ENCOUNTER — Ambulatory Visit
Admission: RE | Admit: 2019-03-27 | Discharge: 2019-03-27 | Disposition: A | Discharge status: Home / Self Care | Payer: BC Managed Care – PPO | Source: Ambulatory Visit | Attending: Pediatrics | Admitting: Pediatrics

## 2019-03-27 ENCOUNTER — Other Ambulatory Visit: Payer: Self-pay

## 2019-03-27 DIAGNOSIS — Z1239 Encounter for other screening for malignant neoplasm of breast: Secondary | ICD-10-CM

## 2019-04-25 ENCOUNTER — Other Ambulatory Visit: Payer: Self-pay

## 2019-04-25 DIAGNOSIS — Z20822 Contact with and (suspected) exposure to covid-19: Secondary | ICD-10-CM

## 2019-04-26 LAB — NOVEL CORONAVIRUS, NAA: SARS-CoV-2, NAA: NOT DETECTED

## 2019-07-02 ENCOUNTER — Telehealth: Payer: BC Managed Care – PPO | Admitting: Physician Assistant

## 2019-07-02 DIAGNOSIS — J069 Acute upper respiratory infection, unspecified: Secondary | ICD-10-CM

## 2019-07-02 MED ORDER — FLUTICASONE PROPIONATE 50 MCG/ACT NA SUSP: 2.0000 | Freq: Every day | NASAL | 6 refills | Status: DC

## 2019-07-02 MED ORDER — BENZONATATE 100 MG PO CAPS: 100.0000 mg | ORAL_CAPSULE | Freq: Three times a day (TID) | ORAL | 0 refills | Status: DC | PRN

## 2019-07-02 NOTE — Progress Notes (Signed)
E-Visit for Corona Virus Screening   Your current symptoms could be consistent with the coronavirus. [facility] has multiple testing sites and you do not need an appointment or lab order. For information on our COVID testing locations and hours go to [email or web address]  Please quarantine yourself while awaiting your test results.  We are enrolling you in our [place] for [place] . Daily you will receive a questionnaire within the [facility] website. Our COVID 19 response team willl be monitoriing your responses daily.  COVID-19 is a respiratory illness with symptoms that are similar to the flu. Symptoms are typically mild to moderate, but there have been cases of severe illness and death due to the virus. The following symptoms may appear 2-14 days after exposure: . Fever . Cough . Shortness of breath or difficulty breathing . Chills . Repeated shaking with chills . Muscle pain . Headache . Sore throat . New loss of taste or smell . Fatigue . Congestion or runny nose . Nausea or vomiting . Diarrhea  It is vitally important that if you feel that you have an infection such as this virus or any other virus that you stay home and away from places where you may spread it to others.  You should self-quarantine for 14 days if you have symptoms that could potentially be coronavirus or have been in close contact a with a person diagnosed with COVID-19 within the last 2 weeks. You should avoid contact with people age 70 and older.   You should wear a mask or cloth face covering over your nose and mouth if you must be around other people or animals, including pets (even at home). Try to stay at least 6 feet away from other people. This will protect the people around you.   For nasal congestion, you may use an oral decongestants such as Mucinex D or if you have glaucoma or high blood pressure use plain Mucinex.  Saline nasal spray or nasal drops can help and  can safely be used as often as needed for congestion.  For your congestion, I have prescribed Fluticasone nasal spray one spray in each nostril twice a day  If you do not have a history of heart disease, hypertension, diabetes or thyroid disease, prostate/bladder issues or glaucoma, you may also use Sudafed to treat nasal congestion.  It is highly recommended that you consult with a pharmacist or your primary care physician to ensure this medication is safe for you to take.     If you have a cough, you may use cough suppressants such as Delsym and Robitussin.  If you have glaucoma or high blood pressure, you can also use Coricidin HBP.   For cough I have prescribed for you A prescription cough medication called Tessalon Perles 100 mg. You may take 1-2 capsules every 8 hours as needed for cough   If you have a sore or scratchy throat, use a saltwater gargle-  to  teaspoon of salt dissolved in a 4-ounce to 8-ounce glass of warm water.  Gargle the solution for approximately 15-30 seconds and then spit.  It is important not to swallow the solution.  You can also use throat lozenges/cough drops and Chloraseptic spray to help with throat pain or discomfort.  Warm or cold liquids can also be helpful in relieving throat pain.  For headache, pain or general discomfort, you can use Ibuprofen or Tylenol as directed.   Some authorities believe that zinc sprays or the use of Echinacea may  shorten the course of your symptoms.   . Be sure to drink plenty of fluids. Water is fine as well as fruit juices, sodas and electrolyte beverages. You may want to stay away from caffeine or alcohol. If you are nauseated, try taking small sips of liquids. How do you know if you are getting enough fluid? Your urine should be a pale yellow or almost colorless. . Get rest. . Taking a steamy shower or using a humidifier may help nasal congestion and ease sore throat pain. You can place a towel over your head and breathe in the  steam from hot water coming from a faucet. . Using a saline nasal spray works much the same way. . Cough drops, hard candies and sore throat lozenges may ease your cough. . Avoid close contacts especially the very young and the elderly . Cover your mouth if you cough or sneeze . Always remember to wash your hands.     Reduce your risk of any infection by using the same precautions used for avoiding the common cold or flu:  Marland Kitchen. Wash your hands often with soap and warm water for at least 20 seconds.  If soap and water are not readily available, use an alcohol-based hand sanitizer with at least 60% alcohol.  . If coughing or sneezing, cover your mouth and nose by coughing or sneezing into the elbow areas of your shirt or coat, into a tissue or into your sleeve (not your hands). . Avoid shaking hands with others and consider head nods or verbal greetings only. . Avoid touching your eyes, nose, or mouth with unwashed hands.  . Avoid close contact with people who are sick. . Avoid places or events with large numbers of people in one location, like concerts or sporting events. . Carefully consider travel plans you have or are making. . If you are planning any travel outside or inside the [place], visit the CDC's Travelers' Health webpage for the latest health notices. . If you have some symptoms but not all symptoms, continue to monitor at home and seek medical attention if your symptoms worsen. . If you are having a medical emergency, call 911    GET HELP RIGHT AWAY IF YOU HAVE EMERGENCY WARNING SIGNS** FOR COVID-19. If you or someone is showing any of these signs seek emergency medical care immediately. Call 911 or proceed to your closest emergency facility if: . You develop worsening high fever. . Trouble breathing . Bluish lips or face . Persistent pain or pressure in the chest . New confusion . Inability to wake or stay awake . You cough up blood. . Your symptoms become more severe  **This  list is not all possible symptoms. Contact your medical provider for any symptoms that are sever or concerning to you.   Greater than 5 minutes, yet less than 10 minutes of time have been spent researching, coordinating and implementing care for this patient today.   Your e-visit answers were reviewed by a board certified advanced clinical practitioner to complete your personal care plan.  Depending on the condition, your plan could have included both over the counter or prescription medications.  If there is a problem please reply once you have received a response from your provider.  Your safety is important to us.  If you have drug allergies check your prescription carefully.    You can use MyChart to ask questions about today's visit, request a non-urgent call back, or ask for a work or school excuse  for 24 hours related to this e-Visit. If it has been greater than 24 hours you will need to follow up with your provider, or enter a new e-Visit to address those concerns. You will get an e-mail in the next two days asking about your experience.  I hope that your e-visit has been valuable and will speed your recovery. Thank you for using e-visits.

## 2019-07-03 ENCOUNTER — Other Ambulatory Visit: Payer: Self-pay

## 2019-07-03 ENCOUNTER — Telehealth: Payer: BC Managed Care – PPO | Admitting: Physician Assistant

## 2019-07-03 DIAGNOSIS — Z20822 Contact with and (suspected) exposure to covid-19: Secondary | ICD-10-CM

## 2019-07-03 DIAGNOSIS — Z20828 Contact with and (suspected) exposure to other viral communicable diseases: Secondary | ICD-10-CM

## 2019-07-03 NOTE — Progress Notes (Signed)
Pt was seen yesterday via e-visit for the same.  No clarification on symptoms today.  Sent MyChart message and called patient for clarification as pt was seen via virtual visit yesterday for the same complaints.  8:34 AM Left voicemail.  8:36 AM Pt reports "head cold" with nasal congestion and sore throat.  No known COVID exposures, but people at work have been coughing.  Pt reports symptoms are worse at night.  Pt reports she has not come to work and employer was questioning this.  Pt does not take antihistamines.  She has used a saline flush.    Will test for COVID and write work note.  E-Visit for Corona Virus Screening   Your current symptoms could be consistent with the coronavirus.  Many health care providers can now test patients at their office but not all are.  [facility] has multiple testing sites. For information on our COVID testing locations and hours go to [email or web address]  Please quarantine yourself while awaiting your test results.  We are enrolling you in our [place] for [place] . Daily you will receive a questionnaire within the [facility] website. Our COVID 19 response team willl be monitoriing your responses daily.    COVID-19 is a respiratory illness with symptoms that are similar to the flu. Symptoms are typically mild to moderate, but there have been cases of severe illness and death due to the virus. The following symptoms may appear 2-14 days after exposure: . Fever . Cough . Shortness of breath or difficulty breathing . Chills . Repeated shaking with chills . Muscle pain . Headache . Sore throat . New loss of taste or smell . Fatigue . Congestion or runny nose . Nausea or vomiting . Diarrhea  It is vitally important that if you feel that you have an infection such as this virus or any other virus that you stay home and away from places where you may spread it to others.  You should self-quarantine for 14 days if  you have symptoms that could potentially be coronavirus or have been in close contact a with a person diagnosed with COVID-19 within the last 2 weeks. You should avoid contact with people age 38 and older.   You should wear a mask or cloth face covering over your nose and mouth if you must be around other people or animals, including pets (even at home). Try to stay at least 6 feet away from other people. This will protect the people around you.  You may also take acetaminophen (Tylenol) as needed for fever.   Reduce your risk of any infection by using the same precautions used for avoiding the common cold or flu:  Marland Kitchen Wash your hands often with soap and warm water for at least 20 seconds.  If soap and water are not readily available, use an alcohol-based hand sanitizer with at least 60% alcohol.  . If coughing or sneezing, cover your mouth and nose by coughing or sneezing into the elbow areas of your shirt or coat, into a tissue or into your sleeve (not your hands). . Avoid shaking hands with others and consider head nods or verbal greetings only. . Avoid touching your eyes, nose, or mouth with unwashed hands.  . Avoid close contact with people who are sick. . Avoid places or events with large numbers of people in one location, like concerts or sporting events. . Carefully consider travel plans you have or are making. . If you are planning any travel outside  or inside the [place], visit the CDC's Travelers' Health webpage for the latest health notices. . If you have some symptoms but not all symptoms, continue to monitor at home and seek medical attention if your symptoms worsen. . If you are having a medical emergency, call 911.  HOME CARE . Only take medications as instructed by your medical team. . Drink plenty of fluids and get plenty of rest. . A steam or ultrasonic humidifier can help if you have congestion.   GET HELP RIGHT AWAY IF YOU HAVE EMERGENCY WARNING SIGNS** FOR COVID-19. If you or  someone is showing any of these signs seek emergency medical care immediately. Call 911 or proceed to your closest emergency facility if: . You develop worsening high fever. . Trouble breathing . Bluish lips or face . Persistent pain or pressure in the chest . New confusion . Inability to wake or stay awake . You cough up blood. . Your symptoms become more severe  **This list is not all possible symptoms. Contact your medical provider for any symptoms that are sever or concerning to you.   MAKE SURE YOU   Understand these instructions.  Will watch your condition.  Will get help right away if you are not doing well or get worse.  Your e-visit answers were reviewed by a board certified advanced clinical practitioner to complete your personal care plan.  Depending on the condition, your plan could have included both over the counter or prescription medications.  If there is a problem please reply once you have received a response from your provider.  Your safety is important to [place].  If you have drug allergies check your prescription carefully.    You can use MyChart to ask questions about today's visit, request a non-urgent call back, or ask for a work or school excuse for 24 hours related to this e-Visit. If it has been greater than 24 hours you will need to follow up with your provider, or enter a new e-Visit to address those concerns. You will get an e-mail in the next two days asking about your experience.  I hope that your e-visit has been valuable and will speed your recovery. Thank you for using e-visits.    Greater than 5 minutes, yet less than 10 minutes of time have been spent researching, coordinating, and implementing care for this patient today

## 2019-07-05 LAB — NOVEL CORONAVIRUS, NAA: SARS-CoV-2, NAA: NOT DETECTED

## 2019-12-04 NOTE — Progress Notes (Signed)
Patient ID: Carrie Nichols, female    DOB: 02-Dec-1965, 54 y.o.   MRN: [id number]  PCP: Jamelle Haring, MD  Chief Complaint  Patient presents with  . New Patient (Initial Visit)  . Establish Care    Subjective:   Carrie Nichols is a 54 y.o. female, presents to clinic with CC of the following:  Chief Complaint  Patient presents with  . New Patient (Initial Visit)  . Establish Care    HPI:  Patient is a 54 year old female who presents new to the practice. In general, she notes she has been feeling well. She had been followed at [facility] primary care in [place], and has not been seen in over a year, with limited opportunities due to Covid.  I noted her last labs were from 2018, and she thinks that is probably accurate.  She notes occasionally she has difficulty sleeping, thinks may be related to being perimenopausal, and takes melatonin 3 mg as needed to help sleep and it is helpful.  She also notices her joints hurt more, especially the hand joints.  They do occasionally swell, and the left thumb is most affected.  She struggles to flex the left thumb often, and in the past, there were times it gets stuck and she helps it release.  She notes her dad had rheumatoid arthritis, her grandfather had rheumatoid arthritis, and both of them were limited from their disease.  She does occasionally feel it in other joints.  Notes her legs are often stiff when she gets up in the morning.  Has not been seen by rheumatology in the past, has no history of thyroid disease. She did note she has some easy bruising at times, and in the past was told it is just because of her age.  Denies any bleeding episodes, with no bleeding per rectum or dark stools noted.  She also notes she still intermittently has episodes where her heart goes fast, can feel it going fast, hard to say if it is irregular or not when it happens, she feels lightheaded and dizzy when it occurs, and she often will cough  or clear her throat to try to help get it to stop.  The last episode was last week at work, and she notes it happens about once every couple weeks.  She did have a heart monitor placed at [facility] in the past, prior echocardiogram, and more recently is followed by Dr. Gwen Pounds from cardiology. She last saw Dr. Gwen Pounds from cardiology in May 2020 for her MVP, hyperlipidemia, and palpitations.  Recommendations included: -No further intervention of mitral valve insufficiency at this time. The patient will continue to watch closely for further significant symptoms of progression of mitral valve disease. -We have discussed risks and benefits of statin therapy. No additional medication management for mixed hyperlipidemia are necessary due to low 10 year cardiovascular risk at this time. -no further interventions of palpitations which appear to be benign in nature and stable at this time. We have discussed the treatment options including diet and exercise and treatment of other lifestyle measures which may reduce symptoms. continue medical management as needed for future symptom relief. -The patient has been instructed to adhere to improving healthy lifestyle. We have specifically addressed the most important factors including abstinence of tobacco use, regular physical activity, healthy diet, and maintaining an appropriate body mass index.  She noted she has a follow-up again in May this year, and I did recommend she share the once  every couple weeks this happening presently with him, as do feel may want to put a monitor on again to help further assess.  She had an abnormal mammogram in her past, more than a couple years ago, and follow-up studies have been okay, with the most recent in July, 2020 yielding the following conclusions: IMPRESSION: 1. No mammographic or sonographic evidence of malignancy involving the LEFT breast. 2. No mammographic evidence of malignancy involving the RIGHT breast. 3. Stable  clustered cysts/apocrine metaplasia involving the UPPER OUTER QUADRANT of the LEFT breast dating back to May, 2018, indicating benignity.  RECOMMENDATION: As the LEFT breast mass has been stable for greater than 2 years, the patient may return to annual screening. Screening mammogram in one year is recommended. She noted that she is overdue for a Pap, and had been getting them done with her primary care person at [facility].  She did have a nonreactive HIV test in 2018 with the last lab tests done in the [facility] system.  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  Colonoscopy past year - polyps removed, benign, back in 4 years due to polyps, Dr. Marva Panda  Patient Active Problem List   Diagnosis Date Noted  . Mitral valve prolapse 05/10/2016  . SVT (supraventricular tachycardia) (HCC) 05/10/2016      Current Outpatient Medications:  .  acetaminophen (TYLENOL) 500 MG tablet, Take 500 mg by mouth every 6 (six) hours as needed., Disp: , Rfl:  .  Melatonin 3 MG CAPS, Take by mouth., Disp: , Rfl:  .  metoprolol succinate (TOPROL-XL) 25 MG 24 hr tablet, Take 1 tablet by mouth 1 day or 1 dose., Disp: , Rfl:    Allergies  Allergen Reactions  . Codeine Rash     Past Surgical History:  Procedure Laterality Date  . COLONOSCOPY WITH PROPOFOL N/A 08/15/2017   Procedure: COLONOSCOPY WITH PROPOFOL;  Surgeon: Christena Deem, MD;  Location: [facility] ENDOSCOPY;  Service: Endoscopy;  Laterality: N/A;     Family History  Problem Relation Age of Onset  . Hyperlipidemia Mother   . High blood pressure Mother   . Skin cancer Father   . Colon polyps Father   . Hyperlipidemia Brother   . Ovarian cancer Paternal Grandmother   . Prostate cancer Paternal Grandfather   . Colon cancer Paternal Uncle   . Breast cancer Neg Hx      Social History   Tobacco Use  . Smoking status: Never Smoker  . Smokeless tobacco: Never Used  Substance  Use Topics  . Alcohol use: No    With staff assistance, above reviewed with the patient today.  ROS:  As per HPI Constitutional: Negative for fever or other Covid concerning symptoms,  Respiratory: Negative for cough. shortness of breath.   Cardiovascular: Negative for chest pain , +  palpitations. No increased LE swelling Gastrointestinal: Negative for persistent abdominal pain, no concerning  bowel changes. Musculoskeletal: + for joint swelling/pains, no gait or balance problems Skin: Negative for new rash Neurological: Negative for increased headache, no increased numbness/tingling/weakness in extremities  No other specific complaints on sytems review (except as listed in HPI above).  No results found for this or any previous visit (from the past 72 hour(s)).   PHQ2/9: Depression screen [facility] 2/9 12/05/2019  Decreased Interest 0  Down, Depressed, Hopeless 0  PHQ - 2 Score 0  Altered sleeping 3  Tired, decreased energy 3  Change in appetite 0  Feeling bad or failure about yourself  0  Trouble concentrating 0  Moving slowly or fidgety/restless 0  Suicidal thoughts 0  PHQ-9 Score 6  Difficult doing work/chores Somewhat difficult   PHQ-2/9 Result is neg for depression   Fall Risk: Fall Risk  12/05/2019  Falls in the past year? 1  Number falls in past yr: 0  Injury with Fall? 1  Comment knot on elbow      Objective:   Vitals:   12/05/19 1320  BP: 128/78  Pulse: 89  Resp: 16  Temp: (!) 97.3 F (36.3 C)  TempSrc: Temporal  SpO2: 97%  Weight: 173 lb 8 oz (78.7 kg)  Height: 5\' 4"  (1.626 m)    Body mass index is 29.78 kg/m.  Physical Exam   NAD, masked, pleasant HEENT - [place]/AT, sclera anicteric, PERRL, EOMI, conj - non-inj'ed, TM's and canals clear, pharynx clear Neck - supple, no adenopathy, no TM, carotids 2+ and = without bruits bilat Car - RRR without m/g/r Pulm- RR and effort normal at rest, CTA without wheeze or rales Abd - soft, NT, ND, BS+,  no masses,  no HSM Back - no CVA tenderness,  Skin- no rash noted on exposed areas, + tattoos on distal upper ext and upper back Ext - no LE edema, a tiny nodularity was noted over the DIP joint of the fifth digit on the right hand with some mild limitations with flexion of the DIP and PIP joint of the fifth digit of the right hand.  The left thumb had very limited flexion of the IP joint, and minimally limited flexion of the MCP joint.  Question some mild diffuse swelling of the thumb, not marked.  Not warm or erythematous.  No markedly active other joints of the hand today.  Grip strength was adequate.  Wrist joints were not active with good range of motion Neuro/psychiatric - affect was not flat, appropriate with conversation  Alert and oriented   Grossly non-focal - good strength on testing extremities, sensation intact to LT in distal extremities, Romberg was negative, no pronator drift, good finger-to-nose, good tandem walk.  Speech and gait are normal   Results for orders placed or performed in visit on 07/03/19  Novel Coronavirus, NAA ([facility])   Specimen: Oropharyngeal(OP) collection in vial transport medium   OROPHARYNGEA  TESTING  Result Value Ref Range   SARS-CoV-2, NAA Not Detected Not Detected       Assessment & Plan:   1. Encounter to establish care with new doctor  2. Encounter for hepatitis C screening test for low risk patient Agreed for screening today - Hepatitis C antibody  3. Screening for lipid disorders She has not been fasting before this visit, although it is hard for her to get here in the morning fasting with her job.  Felt reasonable to check a lipid panel today.  She has not needed a statin in the past, nor was one recommended by the cardiologist on their last visit. - Lipid panel  4. Arthritis Concerned with her history of increasing hand pains and some mild swelling at times.  She has a strong family history of rheumatic arthritis.  Discussed often hand x-rays are  obtained to help assess and some lab test.  Also offered a referral to rheumatology, as it sometimes can be helpful to have them involved sooner than later, hoping to prevent any chronic deformity type changes if there are rheumatologic concerns.  Agreed to get  the screening lab test today, will hold on the hand x-rays.  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 - TSH - COMPLETE METABOLIC PANEL WITH GFR - ANA,IFA RA Diag Pnl w/rflx Tit/Patn - Urinalysis, Complete  5. SVT (supraventricular tachycardia) (HCC) Has still been having episodic episodes, and has had prior work-up with cardiology.  Still followed by [facility] Raymon [facility] at [facility].  He has a follow-up with him again in May and discussed a potential heart monitor to be used again to help ensure no further rhythm concerns.  Do feel that would be much more helpful than a resting EKG obtained today, and we will hold on that given she is followed by cardiology.  Await that follow-up presently, and will check some lab tests today as well. - COMPLETE METABOLIC PANEL WITH GFR -TSH -CBC 6. Mitral valve prolapse As above  7. Easy bruising Do feel checking a CBC will be helpful presently. - CBC with Differential/Platelet  Discussed with Melissa to have the patient make a follow-up on the way out with one of my female colleagues to help with a woman's health exam including a PAP. Await lab results to help determine further follow-up with myself in the future, and emphasized if she desires to see rheumatology sooner than later as we discussed, to let us know and we will be happy to put that referral through.  We will see what the initial screen shows with the lab results presently.    Jamelle Haring, MD 12/05/19 1:36 PM

## 2019-12-05 ENCOUNTER — Ambulatory Visit (INDEPENDENT_AMBULATORY_CARE_PROVIDER_SITE_OTHER): Payer: No Typology Code available for payment source | Admitting: Internal Medicine

## 2019-12-05 ENCOUNTER — Encounter: Payer: Self-pay | Admitting: Internal Medicine

## 2019-12-05 ENCOUNTER — Other Ambulatory Visit: Payer: Self-pay

## 2019-12-05 VITALS — BP 128/78 | HR 89 | Temp 97.3°F | Resp 16 | Ht 64.0 in | Wt 173.5 lb

## 2019-12-05 DIAGNOSIS — R233 Spontaneous ecchymoses: Secondary | ICD-10-CM

## 2019-12-05 DIAGNOSIS — R238 Other skin changes: Secondary | ICD-10-CM | POA: Insufficient documentation

## 2019-12-05 DIAGNOSIS — I341 Nonrheumatic mitral (valve) prolapse: Secondary | ICD-10-CM

## 2019-12-05 DIAGNOSIS — M1990 Unspecified osteoarthritis, unspecified site: Secondary | ICD-10-CM | POA: Diagnosis not present

## 2019-12-05 DIAGNOSIS — I471 Supraventricular tachycardia: Secondary | ICD-10-CM

## 2019-12-05 DIAGNOSIS — Z7689 Persons encountering health services in other specified circumstances: Secondary | ICD-10-CM

## 2019-12-05 DIAGNOSIS — G4709 Other insomnia: Secondary | ICD-10-CM

## 2019-12-05 DIAGNOSIS — Z1159 Encounter for screening for other viral diseases: Secondary | ICD-10-CM | POA: Diagnosis not present

## 2019-12-05 DIAGNOSIS — Z13220 Encounter for screening for lipoid disorders: Secondary | ICD-10-CM

## 2019-12-07 ENCOUNTER — Other Ambulatory Visit: Payer: Self-pay | Admitting: Internal Medicine

## 2019-12-07 DIAGNOSIS — M1990 Unspecified osteoarthritis, unspecified site: Secondary | ICD-10-CM

## 2019-12-07 LAB — COMPLETE METABOLIC PANEL WITH GFR
AG Ratio: 1.6 (calc) (ref 1.0–2.5)
ALT: 14 U/L (ref 6–29)
AST: 13 U/L (ref 10–35)
Albumin: 4.1 g/dL (ref 3.6–5.1)
Alkaline phosphatase (APISO): 55 U/L (ref 37–153)
BUN: 17 mg/dL (ref 7–25)
CO2: 30 mmol/L (ref 20–32)
Calcium: 9.5 mg/dL (ref 8.6–10.4)
Chloride: 106 mmol/L (ref 98–110)
Creat: 0.9 mg/dL (ref 0.50–1.05)
GFR, Est African American: 84 mL/min/{1.73_m2} (ref >=60)
GFR, Est Non African American: 72 mL/min/{1.73_m2} (ref >=60)
Globulin: 2.6 g/dL (calc) (ref 1.9–3.7)
Glucose, Bld: 116 mg/dL — ABNORMAL HIGH (ref 65–99)
Potassium: 4.1 mmol/L (ref 3.5–5.3)
Sodium: 142 mmol/L (ref 135–146)
Total Bilirubin: 0.3 mg/dL (ref 0.2–1.2)
Total Protein: 6.7 g/dL (ref 6.1–8.1)

## 2019-12-07 LAB — CBC WITH DIFFERENTIAL/PLATELET
Absolute Monocytes: 440 cells/uL (ref 200–950)
Basophils Absolute: 62 cells/uL (ref 0–200)
Basophils Relative: 1 %
Eosinophils Absolute: 322 cells/uL (ref 15–500)
Eosinophils Relative: 5.2 %
HCT: 37.8 % (ref 35.0–45.0)
Hemoglobin: 12.5 g/dL (ref 11.7–15.5)
Lymphs Abs: 1730 cells/uL (ref 850–3900)
MCH: 28.5 pg (ref 27.0–33.0)
MCHC: 33.1 g/dL (ref 32.0–36.0)
MCV: 86.1 fL (ref 80.0–100.0)
MPV: 11 fL (ref 7.5–12.5)
Monocytes Relative: 7.1 %
Neutro Abs: 3646 cells/uL (ref 1500–7800)
Neutrophils Relative %: 58.8 %
Platelets: 307 10*3/uL (ref 140–400)
RBC: 4.39 10*6/uL (ref 3.80–5.10)
RDW: 12.6 % (ref 11.0–15.0)
Total Lymphocyte: 27.9 %
WBC: 6.2 10*3/uL (ref 3.8–10.8)

## 2019-12-07 LAB — URINALYSIS, COMPLETE
Bacteria, UA: NONE SEEN /HPF
Bilirubin Urine: NEGATIVE
Glucose, UA: NEGATIVE
Hgb urine dipstick: NEGATIVE
Hyaline Cast: NONE SEEN /LPF
Leukocytes,Ua: NEGATIVE
Nitrite: NEGATIVE
Protein, ur: NEGATIVE
Specific Gravity, Urine: 1.025 (ref 1.001–1.03)
Squamous Epithelial / HPF: NONE SEEN /HPF (ref <=5)
WBC, UA: NONE SEEN /HPF (ref 0–5)
pH: 7 (ref 5.0–8.0)

## 2019-12-07 LAB — LIPID PANEL
Cholesterol: 166 mg/dL (ref <200)
HDL: 46 mg/dL — ABNORMAL LOW (ref >=50)
LDL Cholesterol (Calc): 98 mg/dL (calc)
Non-HDL Cholesterol (Calc): 120 mg/dL (calc) (ref <130)
Total CHOL/HDL Ratio: 3.6 (calc) (ref <5.0)
Triglycerides: 119 mg/dL (ref <150)

## 2019-12-07 LAB — HEPATITIS C ANTIBODY
Hepatitis C Ab: NONREACTIVE
SIGNAL TO CUT-OFF: 0.06 (ref <1.00)

## 2019-12-07 LAB — TSH: TSH: 1.06 mIU/L

## 2019-12-07 LAB — ANA,IFA RA DIAG PNL W/RFLX TIT/PATN
Anti Nuclear Antibody (ANA): POSITIVE — AB
Cyclic Citrullin Peptide Ab: <16 UNITS
Rheumatoid fact SerPl-aCnc: <14 IU/mL (ref <14)

## 2019-12-07 LAB — ANTI-NUCLEAR AB-TITER (ANA TITER): ANA Titer 1: 1:160 {titer} — ABNORMAL HIGH

## 2019-12-07 NOTE — Progress Notes (Signed)
Noting patient's symptoms, her strong family history of rheumatoid disease, and her lab studies which included a positive ANA, I do feel referral to rheumatology would be helpful.  1 was done today.

## 2019-12-10 ENCOUNTER — Encounter: Payer: Self-pay | Admitting: Internal Medicine

## 2019-12-28 ENCOUNTER — Emergency Department
Admission: EM | Admit: 2019-12-28 | Discharge: 2019-12-28 | Disposition: A | Discharge status: Home / Self Care | Payer: Managed Care, Other (non HMO) | Attending: Emergency Medicine | Admitting: Emergency Medicine

## 2019-12-28 ENCOUNTER — Encounter: Payer: Self-pay | Admitting: *Deleted

## 2019-12-28 ENCOUNTER — Other Ambulatory Visit: Payer: Self-pay

## 2019-12-28 ENCOUNTER — Emergency Department: Payer: Managed Care, Other (non HMO)

## 2019-12-28 DIAGNOSIS — S61213A Laceration without foreign body of left middle finger without damage to nail, initial encounter: Secondary | ICD-10-CM | POA: Diagnosis present

## 2019-12-28 DIAGNOSIS — W231XXA Caught, crushed, jammed, or pinched between stationary objects, initial encounter: Secondary | ICD-10-CM | POA: Insufficient documentation

## 2019-12-28 DIAGNOSIS — Y999 Unspecified external cause status: Secondary | ICD-10-CM | POA: Diagnosis not present

## 2019-12-28 DIAGNOSIS — Y9389 Activity, other specified: Secondary | ICD-10-CM | POA: Insufficient documentation

## 2019-12-28 DIAGNOSIS — Y929 Unspecified place or not applicable: Secondary | ICD-10-CM | POA: Diagnosis not present

## 2019-12-28 MED ORDER — ACETAMINOPHEN 325 MG PO TABS
650.0000 mg | ORAL_TABLET | Freq: Once | ORAL | Status: AC
  Administered 2019-12-28: 650 mg via ORAL
  Filled 2019-12-28: qty 2

## 2019-12-28 MED ORDER — LIDOCAINE HCL (PF) 1 % IJ SOLN
5.0000 mL | Freq: Once | INTRAMUSCULAR | Status: AC
  Administered 2019-12-28: 5 mL via INTRADERMAL
  Filled 2019-12-28: qty 5

## 2019-12-28 MED ORDER — CEPHALEXIN 500 MG PO CAPS
500.0000 mg | ORAL_CAPSULE | Freq: Once | ORAL | Status: AC
  Administered 2019-12-28: 500 mg via ORAL
  Filled 2019-12-28: qty 1

## 2019-12-28 MED ORDER — CEPHALEXIN 500 MG PO CAPS: 500.0000 mg | ORAL_CAPSULE | Freq: Four times a day (QID) | ORAL | 0 refills | Status: AC

## 2019-12-28 NOTE — ED Notes (Signed)
Pt refused the Xray stating that she does not believe it is broken. NP notified

## 2019-12-28 NOTE — ED Notes (Signed)
No peripheral IV placed this visit.   Discharge instructions reviewed with patient. Questions fielded by this RN. Patient verbalizes understanding of instructions. Patient discharged home in stable condition per [place], [place]. No acute distress noted at time of discharge.   Pt ambulatory to DC

## 2019-12-28 NOTE — ED Notes (Signed)
Carrie Nichols  DG tech reports pt refused initial DG attempt, pt refusing again at this time

## 2019-12-28 NOTE — ED Triage Notes (Signed)
Patient states she smashed her left 3rd finger between two barn doors. Injury wrapped with gauze. Bleeding controlled.

## 2019-12-28 NOTE — ED Notes (Signed)
Applied dressing to finger laceration.

## 2019-12-28 NOTE — ED Provider Notes (Signed)
[facility] Emergency Department Provider Note  ____________________________________________  Time seen: Approximately 10:12 PM  I have reviewed the triage vital signs and the nursing notes.   HISTORY  Chief Complaint Laceration    HPI Carrie Nichols is a 54 y.o. female that presents to the emergency department for evaluation of finger laceration.  Patient got her finger caught in the door trying to prevent her chickens from escaping.  She is able to feel the tip of her finger but she does have some numbness.  Last tetanus was 3 to 4 years ago.  Past Medical History:  Diagnosis Date  . Dysrhythmia    SVT  . Mitral valve prolapse   . MRSA infection   . Tachycardia     Patient Active Problem List   Diagnosis Date Noted  . Other insomnia 12/05/2019  . Arthritis 12/05/2019  . Easy bruising 12/05/2019  . Mitral valve prolapse 05/10/2016  . SVT (supraventricular tachycardia) ([place]) 05/10/2016    Past Surgical History:  Procedure Laterality Date  . COLONOSCOPY WITH PROPOFOL N/A 08/15/2017   Procedure: COLONOSCOPY WITH PROPOFOL;  Surgeon: Lollie Sails, MD;  Location: [facility] [place] ENDOSCOPY;  Service: Endoscopy;  Laterality: N/A;    Prior to Admission medications   Medication Sig Start Date End Date Taking? Authorizing Provider  acetaminophen (TYLENOL) 500 MG tablet Take 500 mg by mouth every 6 (six) hours as needed.    [provider]  cephALEXin (KEFLEX) 500 MG capsule Take 1 capsule (500 mg total) by mouth 4 (four) times daily for 10 days. 12/28/19 01/07/20  Laban Emperor, PA-C  Melatonin 3 MG CAPS Take by mouth.    [provider]  metoprolol succinate (TOPROL-XL) 25 MG 24 hr tablet Take 1 tablet by mouth 1 day or 1 dose. 07/21/16 12/05/19  [provider]    Allergies Codeine  Family History  Problem Relation Age of Onset  . Hyperlipidemia Mother   . High blood pressure Mother   . Skin cancer Father   . Colon polyps  Father   . Hyperlipidemia Brother   . Ovarian cancer Paternal Grandmother   . Prostate cancer Paternal Grandfather   . Colon cancer Paternal Uncle   . Breast cancer Neg Hx     Social History Social History   Tobacco Use  . Smoking status: Never Smoker  . Smokeless tobacco: Never Used  Substance Use Topics  . Alcohol use: No  . Drug use: No     Review of Systems  Gastrointestinal:  No nausea, no vomiting.  Musculoskeletal: Positive for finger pain. Skin: Negative for rash, abrasions, ecchymosis.  Positive for laceration. Neurological: Negative for headaches   ____________________________________________   PHYSICAL EXAM:  VITAL SIGNS: ED Triage Vitals  Enc Vitals Group     BP 12/28/19 1820 (!) 143/89     Pulse Rate 12/28/19 1820 66     Resp 12/28/19 1820 18     Temp 12/28/19 1820 97.7 F (36.5 C)     Temp Source 12/28/19 1820 Oral     SpO2 12/28/19 1820 100 %     Weight 12/28/19 1823 175 lb (79.4 kg)     Height 12/28/19 1823 5\' 4"  (1.626 m)     Head Circumference --      Peak Flow --      Pain Score 12/28/19 1822 9     Pain Loc --      Pain Edu? --      Excl. in [place]? --  Constitutional: Alert and oriented. Well appearing and in no acute distress. Eyes: Conjunctivae are normal. PERRL. EOMI. Head: Atraumatic. ENT:      Ears:      Nose: No congestion/rhinnorhea.      Mouth/Throat: Mucous membranes are moist.  Neck: No stridor. Cardiovascular: Normal rate, regular rhythm.  Good peripheral circulation. Respiratory: Normal respiratory effort without tachypnea or retractions. Lungs CTAB. Good air entry to the bases with no decreased or absent breath sounds. Musculoskeletal: Full range of motion to all extremities. No gross deformities appreciated. Neurologic:  Normal speech and language. No gross focal neurologic deficits are appreciated.  Skin:  Skin is warm, dry.  2 cm U-shaped flap laceration to volar left ring finger. Psychiatric: Mood and affect are  normal. Speech and behavior are normal. Patient exhibits appropriate insight and judgement.   ____________________________________________   LABS (all labs ordered are listed, but only abnormal results are displayed)  Labs Reviewed - No data to display ____________________________________________  EKG   ____________________________________________  RADIOLOGY   No results found.  ____________________________________________    PROCEDURES  Procedure(s) performed:    Procedures  LACERATION REPAIR Performed by: Enid Derry  Consent: Verbal consent obtained.  Consent given by: patient  Prepped and Draped in normal sterile fashion  Wound explored: No foreign bodies   Laceration Location: left middle finger  Laceration Length: 3 cm  Anesthesia: None  Local anesthetic: lidocaine 1% without epinephrine  Anesthetic total: 3 ml  Irrigation method: syringe  Amount of cleaning: normal saline  Skin closure: 4-0 nylon  Number of sutures: approximately 10  Technique: Simple interrupted  Patient tolerance: Patient tolerated the procedure well with no immediate complications.  Medications  lidocaine (PF) (XYLOCAINE) 1 % injection 5 mL (5 mLs Intradermal Given by Other 12/28/19 2230)  acetaminophen (TYLENOL) tablet 650 mg (650 mg Oral Given 12/28/19 2339)  cephALEXin (KEFLEX) capsule 500 mg (500 mg Oral Given 12/28/19 2340)     ____________________________________________   INITIAL IMPRESSION / ASSESSMENT AND PLAN / ED COURSE  Pertinent labs & imaging results that were available during my care of the patient were reviewed by me and considered in my medical decision making (see chart for details).  Review of the [place] CSRS was performed in accordance of the [facility] prior to dispensing any controlled drugs.     Patient presented to the emergency department for evaluation of finger laceration.  Vital signs and exam are reassuring.  Laceration was repaired  with stitches.  Finger splint was placed.  Patient declines x-ray.  Tetanus is up-to-date.  Patient will be discharged home with prescriptions for Keflex. Patient is to follow up with orthopedics as directed. Patient is given ED precautions to return to the ED for any worsening or new symptoms.   Logann Whitebread was evaluated in Emergency Department on 12/28/2019 for the symptoms described in the history of present illness. She was evaluated in the context of the global COVID-19 pandemic, which necessitated consideration that the patient might be at risk for infection with the SARS-CoV-2 virus that causes COVID-19. Institutional protocols and algorithms that pertain to the evaluation of patients at risk for COVID-19 are in a state of rapid change based on information released by regulatory bodies including the CDC and federal and state organizations. These policies and algorithms were followed during the patient's care in the ED.  ____________________________________________  FINAL CLINICAL IMPRESSION(S) / ED DIAGNOSES  Final diagnoses:  Laceration of left middle finger without foreign body without damage to nail,  initial encounter      NEW MEDICATIONS STARTED DURING THIS VISIT:  ED Discharge Orders         Ordered    cephALEXin (KEFLEX) 500 MG capsule  4 times daily     12/28/19 2331              This chart was dictated using voice recognition software/Dragon. Despite best efforts to proofread, errors can occur which can change the meaning. Any change was purely unintentional.    Enid Derry, PA-C 12/28/19 2349    Shaune Pollack, MD 12/30/19 1208

## 2019-12-30 ENCOUNTER — Emergency Department
Admission: EM | Admit: 2019-12-30 | Discharge: 2019-12-30 | Disposition: A | Discharge status: Home / Self Care | Payer: Managed Care, Other (non HMO) | Attending: Student in an Organized Health Care Education/Training Program | Admitting: Student in an Organized Health Care Education/Training Program

## 2019-12-30 ENCOUNTER — Emergency Department: Payer: Managed Care, Other (non HMO)

## 2019-12-30 ENCOUNTER — Other Ambulatory Visit: Payer: Self-pay

## 2019-12-30 DIAGNOSIS — Y939 Activity, unspecified: Secondary | ICD-10-CM | POA: Diagnosis not present

## 2019-12-30 DIAGNOSIS — S62639A Displaced fracture of distal phalanx of unspecified finger, initial encounter for closed fracture: Secondary | ICD-10-CM

## 2019-12-30 DIAGNOSIS — Y999 Unspecified external cause status: Secondary | ICD-10-CM | POA: Diagnosis not present

## 2019-12-30 DIAGNOSIS — S62663A Nondisplaced fracture of distal phalanx of left middle finger, initial encounter for closed fracture: Secondary | ICD-10-CM | POA: Diagnosis not present

## 2019-12-30 DIAGNOSIS — Z79899 Other long term (current) drug therapy: Secondary | ICD-10-CM | POA: Insufficient documentation

## 2019-12-30 DIAGNOSIS — W230XXA Caught, crushed, jammed, or pinched between moving objects, initial encounter: Secondary | ICD-10-CM | POA: Diagnosis not present

## 2019-12-30 DIAGNOSIS — Z4800 Encounter for change or removal of nonsurgical wound dressing: Secondary | ICD-10-CM | POA: Diagnosis present

## 2019-12-30 DIAGNOSIS — Y929 Unspecified place or not applicable: Secondary | ICD-10-CM | POA: Diagnosis not present

## 2019-12-30 DIAGNOSIS — Z5189 Encounter for other specified aftercare: Secondary | ICD-10-CM

## 2019-12-30 NOTE — ED Provider Notes (Signed)
[facility] EMERGENCY DEPARTMENT Provider Note   CSN: [id number] Arrival date & time: 12/30/19  2671     History Chief Complaint  Patient presents with  . Wound Check    Carrie Nichols is a 54 y.o. female presents to the emergency department for recheck of her left middle finger.  Patient suffered a laceration 2 days ago, finger was caught in a door trying to prevent chickens from escaping.  She suffered a flap-like laceration to the volar aspect of the left third digit.  She comes in today because she is having little bit of numbness and discoloration along the pads of the left third digit.  HPI     Past Medical History:  Diagnosis Date  . Dysrhythmia    SVT  . Mitral valve prolapse   . MRSA infection   . Tachycardia     Patient Active Problem List   Diagnosis Date Noted  . Other insomnia 12/05/2019  . Arthritis 12/05/2019  . Easy bruising 12/05/2019  . Mitral valve prolapse 05/10/2016  . SVT (supraventricular tachycardia) ([place]) 05/10/2016    Past Surgical History:  Procedure Laterality Date  . COLONOSCOPY WITH PROPOFOL N/A 08/15/2017   Procedure: COLONOSCOPY WITH PROPOFOL;  Surgeon: Lollie Sails, MD;  Location: [facility] ENDOSCOPY;  Service: Endoscopy;  Laterality: N/A;     OB History   No obstetric history on file.     Family History  Problem Relation Age of Onset  . Hyperlipidemia Mother   . High blood pressure Mother   . Skin cancer Father   . Colon polyps Father   . Hyperlipidemia Brother   . Ovarian cancer Paternal Grandmother   . Prostate cancer Paternal Grandfather   . Colon cancer Paternal Uncle   . Breast cancer Neg Hx     Social History   Tobacco Use  . Smoking status: Never Smoker  . Smokeless tobacco: Never Used  Substance Use Topics  . Alcohol use: No  . Drug use: No    Home Medications Prior to Admission medications   Medication Sig Start Date End Date Taking? Authorizing Provider  acetaminophen  (TYLENOL) 500 MG tablet Take 500 mg by mouth every 6 (six) hours as needed.    [provider]  cephALEXin (KEFLEX) 500 MG capsule Take 1 capsule (500 mg total) by mouth 4 (four) times daily for 10 days. 12/28/19 01/07/20  Laban Emperor, PA-C  Melatonin 3 MG CAPS Take by mouth.    [provider]  metoprolol succinate (TOPROL-XL) 25 MG 24 hr tablet Take 1 tablet by mouth 1 day or 1 dose. 07/21/16 12/05/19  [provider]    Allergies    Codeine  Review of Systems   Review of Systems  Constitutional: Negative for fever.  Skin: Positive for color change and wound. Negative for rash.  Neurological: Positive for numbness. Negative for weakness.    Physical Exam Updated Vital Signs BP (!) 144/85 (BP Location: Right Arm)   Pulse 76   Temp 98.1 F (36.7 C) (Oral)   Resp 16   Ht 5\' 4"  (1.626 m)   Wt 79.4 kg   SpO2 98%   BMI 30.04 kg/m   Physical Exam Constitutional:      Appearance: She is well-developed.  HENT:     Head: Normocephalic and atraumatic.  Eyes:     Conjunctiva/sclera: Conjunctivae normal.  Cardiovascular:     Rate and Rhythm: Normal rate.  Pulmonary:     Effort: Pulmonary effort  is normal. No respiratory distress.  Musculoskeletal:        General: Normal range of motion.     Cervical back: Normal range of motion.     Comments: Left third digit with flap-like laceration to the pad space of the left third digit.  Tissue is moist and white.  There is slight decrease in sensation with palpation.  Dorsal aspect of the digit has normal sensation with 2+ cap refill.  No warmth redness or drainage.  Mild swelling noted along the distal phalanx.  No subungual hematoma.  Able to maintain full active extension of the DIP joint, slight flexion of the DIP joint present, limited some with swelling.  Skin:    General: Skin is warm.     Findings: No rash.  Neurological:     Mental Status: She is alert and oriented to person, place, and time.    Psychiatric:        Behavior: Behavior normal.        Thought Content: Thought content normal.     ED Results / Procedures / Treatments   Labs (all labs ordered are listed, but only abnormal results are displayed) Labs Reviewed - No data to display  EKG None  Radiology No results found.  X-rays of the left third digit ordered and reviewed by me today show nondisplaced distal tuft fracture of the left third digit with no other fracture noted.  No foreign body.  Procedures Procedures (including critical care time)  Medications Ordered in ED Medications - No data to display  ED Course  I have reviewed the triage vital signs and the nursing notes.  Pertinent labs & imaging results that were available during my care of the patient were reviewed by me and considered in my medical decision making (see chart for details).    MDM Rules/Calculators/A&P                      54 year old female with laceration to the left third digit 2 days ago.  Having a little bit of numbness and concerned.  She is getting good blood supply to the distal phalanx.  2+ cap refill.  Sensation decreased along the flap-like laceration which he understands may not take.  No signs of any infection today.  She will continue to keep clean and dry.  X-rays today did show a tuft fracture, nondisplaced.  Continue with cephalexin.  Follow-up in 5 to 8 days for suture removal. Final Clinical Impression(s) / ED Diagnoses Final diagnoses:  Visit for wound check  Closed fracture of tuft of distal phalanx of finger    Rx / DC Orders ED Discharge Orders    None       Ronnette Juniper 12/30/19 1254    Willy Eddy, MD 12/30/19 [phone]

## 2019-12-30 NOTE — ED Triage Notes (Signed)
Pt Is here for a wound check of the left 3rd finger.

## 2019-12-30 NOTE — Discharge Instructions (Addendum)
Please keep laceration site clean and dry.  Cleanse daily with peroxide or alcohol.  Keep laceration site covered during the day.  Follow-up with primary care provider, walk-in clinic in 5 to 8 days for suture removal and Steri-Strip application.

## 2019-12-30 NOTE — ED Notes (Signed)
See triage note, pt reports she was here Friday for smashing her left hand third finger. States today her finger looks grey and she has no feeling to the part of her finger that has stitches.  Tip of left third finger appears grey around stitches area.

## 2020-01-01 ENCOUNTER — Other Ambulatory Visit: Payer: Self-pay

## 2020-01-01 ENCOUNTER — Other Ambulatory Visit (HOSPITAL_COMMUNITY)
Admission: RE | Admit: 2020-01-01 | Discharge: 2020-01-01 | Disposition: A | Discharge status: Home / Self Care | Payer: Managed Care, Other (non HMO) | Source: Ambulatory Visit | Attending: Family Medicine | Admitting: Family Medicine

## 2020-01-01 ENCOUNTER — Ambulatory Visit (INDEPENDENT_AMBULATORY_CARE_PROVIDER_SITE_OTHER): Payer: No Typology Code available for payment source | Admitting: Family Medicine

## 2020-01-01 ENCOUNTER — Encounter: Payer: Self-pay | Admitting: Family Medicine

## 2020-01-01 VITALS — BP 126/82 | HR 93 | Temp 98.5°F | Resp 14 | Ht 64.0 in | Wt 174.3 lb

## 2020-01-01 DIAGNOSIS — Z1231 Encounter for screening mammogram for malignant neoplasm of breast: Secondary | ICD-10-CM | POA: Diagnosis not present

## 2020-01-01 DIAGNOSIS — Z124 Encounter for screening for malignant neoplasm of cervix: Secondary | ICD-10-CM

## 2020-01-01 DIAGNOSIS — Z0000 Encounter for general adult medical examination without abnormal findings: Secondary | ICD-10-CM | POA: Diagnosis not present

## 2020-01-01 NOTE — Patient Instructions (Addendum)
Preventive Care 40-55 Years Old, Female Preventive care refers to visits with your health care provider and lifestyle choices that can promote health and wellness. This includes:  A yearly physical exam. This may also be called an annual well check.  Regular dental visits and eye exams.  Immunizations.  Screening for certain conditions.  Healthy lifestyle choices, such as eating a healthy diet, getting regular exercise, not using drugs or products that contain nicotine and tobacco, and limiting alcohol use. What can I expect for my preventive care visit? Physical exam Your health care provider will check your:  Height and weight. This may be used to calculate body mass index (BMI), which tells if you are at a healthy weight.  Heart rate and blood pressure.  Skin for abnormal spots. Counseling Your health care provider may ask you questions about your:  Alcohol, tobacco, and drug use.  Emotional well-being.  Home and relationship well-being.  Sexual activity.  Eating habits.  Work and work environment.  Method of birth control.  Menstrual cycle.  Pregnancy history. What immunizations do I need?  Influenza (flu) vaccine  This is recommended every year. Tetanus, diphtheria, and pertussis (Tdap) vaccine  You may need a Td booster every 10 years. Varicella (chickenpox) vaccine  You may need this if you have not been vaccinated. Zoster (shingles) vaccine  You may need this after age 60. Measles, mumps, and rubella (MMR) vaccine  You may need at least one dose of MMR if you were born in 1957 or later. You may also need a second dose. Pneumococcal conjugate (PCV13) vaccine  You may need this if you have certain conditions and were not previously vaccinated. Pneumococcal polysaccharide (PPSV23) vaccine  You may need one or two doses if you smoke cigarettes or if you have certain conditions. Meningococcal conjugate (MenACWY) vaccine  You may need this if you  have certain conditions. Hepatitis A vaccine  You may need this if you have certain conditions or if you travel or work in places where you may be exposed to hepatitis A. Hepatitis B vaccine  You may need this if you have certain conditions or if you travel or work in places where you may be exposed to hepatitis B. Haemophilus influenzae type b (Hib) vaccine  You may need this if you have certain conditions. Human papillomavirus (HPV) vaccine  If recommended by your health care provider, you may need three doses over 6 months. You may receive vaccines as individual doses or as more than one vaccine together in one shot (combination vaccines). Talk with your health care provider about the risks and benefits of combination vaccines. What tests do I need? Blood tests  Lipid and cholesterol levels. These may be checked every 5 years, or more frequently if you are over 50 years old.  Hepatitis C test.  Hepatitis B test. Screening  Lung cancer screening. You may have this screening every year starting at age 55 if you have a 30-pack-year history of smoking and currently smoke or have quit within the past 15 years.  Colorectal cancer screening. All adults should have this screening starting at age 50 and continuing until age 75. Your health care provider may recommend screening at age 45 if you are at increased risk. You will have tests every 1-10 years, depending on your results and the type of screening test.  Diabetes screening. This is done by checking your blood sugar (glucose) after you have not eaten for a while (fasting). You may have this   done every 1-3 years.  Mammogram. This may be done every 1-2 years. Talk with your health care provider about when you should start having regular mammograms. This may depend on whether you have a family history of breast cancer.  BRCA-related cancer screening. This may be done if you have a family history of breast, ovarian, tubal, or peritoneal  cancers.  Pelvic exam and Pap test. This may be done every 3 years starting at age 21. Starting at age 30, this may be done every 5 years if you have a Pap test in combination with an HPV test. Other tests  Sexually transmitted disease (STD) testing.  Bone density scan. This is done to screen for osteoporosis. You may have this scan if you are at high risk for osteoporosis. Follow these instructions at home: Eating and drinking  Eat a diet that includes fresh fruits and vegetables, whole grains, lean protein, and low-fat dairy.  Take vitamin and mineral supplements as recommended by your health care provider.  Do not drink alcohol if: ? Your health care provider tells you not to drink. ? You are pregnant, may be pregnant, or are planning to become pregnant.  If you drink alcohol: ? Limit how much you have to 0-1 drink a day. ? Be aware of how much alcohol is in your drink. In the [place] one drink equals one 12 oz bottle of beer (355 mL), one 5 oz glass of wine (148 mL), or one 1 oz glass of hard liquor (44 mL). Lifestyle  Take daily care of your teeth and gums.  Stay active. Exercise for at least 30 minutes on 5 or more days each week.  Do not use any products that contain nicotine or tobacco, such as cigarettes, e-cigarettes, and chewing tobacco. If you need help quitting, ask your health care provider.  If you are sexually active, practice safe sex. Use a condom or other form of birth control (contraception) in order to prevent pregnancy and STIs (sexually transmitted infections).  If told by your health care provider, take low-dose aspirin daily starting at age 50. What's next?  Visit your health care provider once a year for a well check visit.  Ask your health care provider how often you should have your eyes and teeth checked.  Stay up to date on all vaccines. This information is not intended to replace advice given to you by your health care provider. Make sure you  discuss any questions you have with your health care provider. Document Revised: 05/18/2018 Document Reviewed: 05/18/2018 Elsevier Patient Education  2020 [employer] Inc.   Preventing Osteoporosis, Adult Osteoporosis is a condition that causes the bones to lose density. This means that the bones become thinner, and the normal spaces in bone tissue become larger. Low bone density can make the bones weak and cause them to break more easily. Osteoporosis cannot always be prevented, but you can take steps to lower your risk of developing this condition. How can this condition affect me? If you develop osteoporosis, you will be more likely to break bones in your wrist, spine, or hip. Even a minor accident or injury can be enough to break weak bones. The bones will also be slower to heal. Osteoporosis can cause other problems as well, such as a stooped posture or trouble with movement. Osteoporosis can occur with aging. As you get older, you may lose bone tissue more quickly, or it may be replaced more slowly. Osteoporosis is more likely to develop if you have   poor nutrition or do not get enough calcium or vitamin D. Other lifestyle factors can also play a role. By eating a well-balanced diet and making lifestyle changes, you can help keep your bones strong and healthy, lowering your chances of developing osteoporosis. What can increase my risk? The following factors may make you more likely to develop osteoporosis:  Having a family history of the condition.  Having poor nutrition or not getting enough calcium or vitamin D.  Using certain medicines, such as steroid medicines or antiseizure medicines.  Being any of the following: ? 50 years of age or older. ? Female. ? A woman who has gone through menopause (is postmenopausal). ? White (Caucasian) or of Asian descent.  Smoking or having a history of smoking.  Not being physically active (being sedentary).  Having a small body frame. What  actions can I take to prevent this?  Get enough calcium   Make sure you get enough calcium every day. Calcium is the most important mineral for bone health. Most people can get enough calcium from their diet, but supplements may be recommended for people who are at risk for osteoporosis. Follow these guidelines: ? If you are age 50 or younger, aim to get 1,000 mg of calcium every day. ? If you are older than age 50, aim to get 1,200 mg of calcium every day.  Good sources of calcium include: ? Dairy products, such as low-fat or nonfat milk, cheese, and yogurt. ? Dark green leafy vegetables, such as bok choy and broccoli. ? Foods that have had calcium added to them (calcium-fortified foods), such as orange juice, cereal, bread, soy beverages, and tofu products. ? Nuts, such as almonds.  Check nutrition labels to see how much calcium is in a food or drink. Get enough vitamin D  Try to get enough vitamin D every day. Vitamin D is the most essential vitamin for bone health. It helps the body absorb calcium. Follow these guidelines for how much vitamin D to get from food: ? If you are age 70 or younger, aim to get at least 600 international units (IU) every day. Your health care provider may suggest more. ? If you are older than age 70, aim to get at least 800 international units every day. Your health care provider may suggest more.  Good sources of vitamin D in your diet include: ? Egg yolks. ? Oily fish, such as salmon, sardines, and tuna. ? Milk and cereal fortified with vitamin D.  Your body also makes vitamin D when you are out in the sun. Exposing the bare skin on your face, arms, legs, or back to the sun for no more than 30 minutes a day, 2 times a week is more than enough. Beyond that, make sure you use sunblock to protect your skin from sunburn, which increases your risk for skin cancer. Exercise  Stay active and get exercise every day.  Ask your health care provider what types of  exercise are best for you. Weight-bearing and strength-building activities are important for building and maintaining healthy bones. Some examples of these types of activities include: ? Walking and hiking. ? Jogging and running. ? Dancing. ? Gym exercises. ? Lifting weights. ? Tennis and racquetball. ? Climbing stairs. ? Aerobics. Make other lifestyle changes  Do not use any products that contain nicotine or tobacco, such as cigarettes, e-cigarettes, and chewing tobacco. If you need help quitting, ask your health care provider.  Lose weight if you are overweight.    If you drink alcohol: ? Limit how much you use to:  0-1 drink a day for nonpregnant women.  0-2 drinks a day for men. ? Be aware of how much alcohol is in your drink. In the [place] one drink equals one 12 oz bottle of beer (355 mL), one 5 oz glass of wine (148 mL), or one 1 oz glass of hard liquor (44 mL). Where to find support If you need help making changes to prevent osteoporosis, talk with your health care provider. You can ask for a referral to a diet and nutrition specialist (dietitian) and a physical therapist. Where to find more information Learn more about osteoporosis from:  NIH Osteoporosis and Related Bone Diseases National Resource Center: www.bones.nih.gov  [place] Office on Women's Health: www.womenshealth.gov  National Osteoporosis Foundation: www.nof.org Summary  Osteoporosis is a condition that causes weak bones that are more likely to break.  Eat a healthy diet, making sure you get enough calcium and vitamin D, and stay active by getting regular exercise to help prevent osteoporosis.  Other ways to reduce your risk of osteoporosis include maintaining a healthy weight and avoiding alcohol and products that contain nicotine or tobacco. This information is not intended to replace advice given to you by your health care provider. Make sure you discuss any questions you have with your health care  provider. Document Revised: 04/06/2019 Document Reviewed: 04/06/2019 Elsevier Patient Education  2020 [employer] Inc.  

## 2020-01-01 NOTE — Progress Notes (Signed)
Patient: Verona Hartshorn, Female    DOB: 1966/05/06, 54 y.o.   MRN: [id number] Towanda Malkin, MD Visit Date: 01/01/2020  Today's Provider: Delsa Grana, PA-C   Chief Complaint  Patient presents with  . Annual Exam    pap   Subjective:   Annual physical exam:  Marlyn Tondreau is a 54 y.o. female who presents today for complete physical exam:  Exercise/Activity:  Exercises regularly - tries to stay active throughout the day, even if its simple activity because it helps prevent her joint pain and stiffness Diet/nutrition:  Tries to eat healthy more whole food fish etc Sleep:  3 mg melatonin for sleep problems  USPSTF grade A and B recommendations - reviewed and addressed today  Depression:  Phq 9 completed today by patient, was reviewed by me with patient in the room PHQ score is neg, pt feels good PHQ 2/9 Scores 01/01/2020 12/05/2019  PHQ - 2 Score 0 0  PHQ- 9 Score 0 6   Depression screen [facility] 2/9 01/01/2020 12/05/2019  Decreased Interest 0 0  Down, Depressed, Hopeless 0 0  PHQ - 2 Score 0 0  Altered sleeping 0 3  Tired, decreased energy 0 3  Change in appetite 0 0  Feeling bad or failure about yourself  0 0  Trouble concentrating 0 0  Moving slowly or fidgety/restless 0 0  Suicidal thoughts 0 0  PHQ-9 Score 0 6  Difficult doing work/chores Not difficult at all Somewhat difficult    Alcohol screening:   Office Visit from 01/01/2020 in [facility]  AUDIT-C Score  0      Immunizations and Health Maintenance: Health Maintenance  Topic Date Due  . PAP SMEAR-Modifier  Never done  . MAMMOGRAM  03/26/2020  . INFLUENZA VACCINE  04/20/2020  . TETANUS/TDAP  12/27/2024  . COLONOSCOPY  08/16/2027  . HIV Screening  Completed    PAP 05/26/2016 last pap with PCP    Hep C Screening: done  STD testing and prevention (HIV/chl/gon/syphilis):  see above, no additional testing desired by pt today  Intimate partner violence:  Feels safe    Sexual History/Pain during Intercourse: a partner for 30 years, no problems or pain with sex   Menstrual History/LMP/Abnormal Bleeding:thinks menopausal right now, hot flashes, no menses in months, can't remember the last one.  No LMP recorded. Patient is perimenopausal.  Incontinence Symptoms: none  Breast cancer:  UTD due in July -ordered today pt will call [place] Last Mammogram: see HM list above BRCA gene screening: None done in the past  Cervical cancer screening: due today  Pt family hx of cancers - breast, ovarian, uterine, colon:   Breast lump monitored and unremarkable, resume normal screening Paternal uncle and paternal grandfather with CRC  Osteoporosis:   Discussion on osteoporosis per age, including high calcium and vitamin D supplementation, weight bearing exercises Pt is not supplementing with daily calcium/Vit D. No past bone scan/dexa Roughly experienced menopause at age 69-53  Skin cancer:  Hx of skin CA -  NO Discussed atypical lesions   Colorectal cancer:  Saw Dr Gustavo Lah - 2018?  Repeat in 5 years some polyps removed  Colonoscopy is UTD,  Discussed concerning signs and sx of CRC, pt denies melena, hematochezia, no change in BM/caliber  Lung cancer:   Low Dose CT Chest recommended if Age 28-80 years, 30 pack-year currently smoking OR have quit w/in 15years. Patient does not qualify.    Social History   Tobacco  Use  . Smoking status: Never Smoker  . Smokeless tobacco: Never Used  Substance Use Topics  . Alcohol use: No  . Drug use: No       Office Visit from 01/01/2020 in [facility]  AUDIT-C Score  0      Family History  Problem Relation Age of Onset  . Hyperlipidemia Mother   . High blood pressure Mother   . Skin cancer Father   . Colon polyps Father   . Hyperlipidemia Brother   . Ovarian cancer Paternal Grandmother   . Prostate cancer Paternal Grandfather   . Colon cancer Paternal Uncle   . Breast cancer Neg Hx       Blood pressure/Hypertension: BP Readings from Last 3 Encounters:  01/01/20 126/82  12/30/19 (!) 152/83  12/28/19 (!) 143/90    Weight/Obesity: Wt Readings from Last 3 Encounters:  01/01/20 174 lb 4.8 oz (79.1 kg)  12/30/19 175 lb (79.4 kg)  12/28/19 175 lb (79.4 kg)   BMI Readings from Last 3 Encounters:  01/01/20 29.92 kg/m  12/30/19 30.04 kg/m  12/28/19 30.04 kg/m     Lipids:  Lab Results  Component Value Date   CHOL 166 12/05/2019   Lab Results  Component Value Date   HDL 46 (L) 12/05/2019   Lab Results  Component Value Date   LDLCALC 98 12/05/2019   Lab Results  Component Value Date   TRIG 119 12/05/2019   Lab Results  Component Value Date   CHOLHDL 3.6 12/05/2019   No results found for: LDLDIRECT Based on the results of lipid panel his/her cardiovascular risk factor ( using Kathryn )  in the next 10 years is: The 10-year ASCVD risk score Mikey Bussing DC Brooke Bonito., et al., 2013) is: 1.7%   Values used to calculate the score:     Age: 66 years     Sex: Female     Is Non-Hispanic African American: No     Diabetic: No     Tobacco smoker: No     Systolic Blood Pressure: 865 mmHg     Is BP treated: No     HDL Cholesterol: 46 mg/dL     Total Cholesterol: 166 mg/dL Glucose:  Glucose  Date Value Ref Range Status  03/12/2012 87 65 - 99 mg/dL Final   Glucose, Bld  Date Value Ref Range Status  12/05/2019 116 (H) 65 - 99 mg/dL Final    Comment:    .            Fasting reference interval . For someone without known diabetes, a glucose value between 100 and 125 mg/dL is consistent with prediabetes and should be confirmed with a follow-up test. .    Hypertension: BP Readings from Last 3 Encounters:  01/01/20 126/82  12/30/19 (!) 152/83  12/28/19 (!) 143/90   Obesity: Wt Readings from Last 3 Encounters:  01/01/20 174 lb 4.8 oz (79.1 kg)  12/30/19 175 lb (79.4 kg)  12/28/19 175 lb (79.4 kg)   BMI Readings from Last 3 Encounters:  01/01/20 29.92  kg/m  12/30/19 30.04 kg/m  12/28/19 30.04 kg/m      Advanced Care Planning:  A voluntary discussion about advance care planning including the explanation and discussion of advance directives.   Discussed health care proxy and Living will Patient does not have a living will at present time.   Social History      She        Social History  Socioeconomic History  . Marital status: Single    Spouse name: Not on file  . Number of children: Not on file  . Years of education: Not on file  . Highest education level: Not on file  Occupational History  . Not on file  Tobacco Use  . Smoking status: Never Smoker  . Smokeless tobacco: Never Used  Substance and Sexual Activity  . Alcohol use: No  . Drug use: No  . Sexual activity: Not on file  Other Topics Concern  . Not on file  Social History Narrative  . Not on file   Social Determinants of Health   Financial Resource Strain:   . Difficulty of Paying Living Expenses:   Food Insecurity:   . Worried About Charity fundraiser in the Last Year:   . Arboriculturist in the Last Year:   Transportation Needs:   . Film/video editor (Medical):   Marland Kitchen Lack of Transportation (Non-Medical):   Physical Activity:   . Days of Exercise per Week:   . Minutes of Exercise per Session:   Stress:   . Feeling of Stress :   Social Connections:   . Frequency of Communication with Friends and Family:   . Frequency of Social Gatherings with Friends and Family:   . Attends Religious Services:   . Active Member of Clubs or Organizations:   . Attends Archivist Meetings:   Marland Kitchen Marital Status:     Family History        Family History  Problem Relation Age of Onset  . Hyperlipidemia Mother   . High blood pressure Mother   . Skin cancer Father   . Colon polyps Father   . Hyperlipidemia Brother   . Ovarian cancer Paternal Grandmother   . Prostate cancer Paternal Grandfather   . Colon cancer Paternal Uncle   . Breast cancer  Neg Hx     Patient Active Problem List   Diagnosis Date Noted  . Other insomnia 12/05/2019  . Arthritis 12/05/2019  . Easy bruising 12/05/2019  . Mitral valve prolapse 05/10/2016  . SVT (supraventricular tachycardia) ([place]) 05/10/2016    Past Surgical History:  Procedure Laterality Date  . COLONOSCOPY WITH PROPOFOL N/A 08/15/2017   Procedure: COLONOSCOPY WITH PROPOFOL;  Surgeon: Lollie Sails, MD;  Location[facility] END[facility]COPY;  Service: Endoscopy;  Laterality: N/A;     Current Outpatient Medications:  .  acetaminophen (TYLENOL) 500 MG tablet, Take 500 mg by mouth every 6 (six) hours as needed., Disp: , Rfl:  .  cephALEXin (KEFLEX) 500 MG capsule, Take 1 capsule (500 mg total) by mouth 4 (four) times daily for 10 days., Disp: 40 capsule, Rfl: 0 .  Melatonin 3 MG CAPS, Take by mouth., Disp: , Rfl:  .  metoprolol succinate (TOPROL-XL) 25 MG 24 hr tablet, Take 1 tablet by mouth 1 day or 1 dose., Disp: , Rfl:   Allergies  Allergen Reactions  . Codeine Rash    Patient Care Team: Towanda Malkin, MD as PCP - General (Internal Medicine)  Review of Systems  Constitutional: Negative.  Negative for activity change, appetite change, fatigue and unexpected weight change.  HENT: Negative.   Eyes: Negative.   Respiratory: Negative.  Negative for shortness of breath.   Cardiovascular: Negative.  Negative for chest pain, palpitations and leg swelling.  Gastrointestinal: Negative.  Negative for abdominal pain and blood in stool.  Endocrine: Negative.   Genitourinary: Negative.   Musculoskeletal: Negative.  Negative  for arthralgias, gait problem, joint swelling and myalgias.  Skin: Negative.  Negative for color change, pallor and rash.  Allergic/Immunologic: Negative.   Neurological: Negative.  Negative for syncope and weakness.  Hematological: Negative.   Psychiatric/Behavioral: Negative.  Negative for confusion, dysphoric mood, self-injury and suicidal ideas. The patient is not  nervous/anxious.      I personally reviewed active problem list, medication list, allergies, family history, social history, health maintenance, notes from last encounter, lab results, imaging with the patient/caregiver today.        Objective:   Vitals:  Vitals:   01/01/20 1502  BP: 126/82  Pulse: 93  Resp: 14  Temp: 98.5 F (36.9 C)  SpO2: 97%  Weight: 174 lb 4.8 oz (79.1 kg)  Height: '5\' 4"'  (1.626 m)    Body mass index is 29.92 kg/m.  Physical Exam Exam conducted with a chaperone present.  Constitutional:      General: She is not in acute distress.    Appearance: Normal appearance. She is well-developed. She is not toxic-appearing or diaphoretic.  HENT:     Head: Normocephalic and atraumatic.     Right Ear: External ear normal.     Left Ear: External ear normal.     Nose: Nose normal.     Mouth/Throat:     Pharynx: Uvula midline.  Eyes:     General: Lids are normal.     Conjunctiva/sclera: Conjunctivae normal.     Pupils: Pupils are equal, round, and reactive to light.  Neck:     Trachea: Phonation normal. No tracheal deviation.  Cardiovascular:     Rate and Rhythm: Normal rate and regular rhythm.     Pulses: Normal pulses.          Radial pulses are 2+ on the right side and 2+ on the left side.       Posterior tibial pulses are 2+ on the right side and 2+ on the left side.     Heart sounds: Normal heart sounds. No murmur. No friction rub. No gallop.   Pulmonary:     Effort: Pulmonary effort is normal. No respiratory distress.     Breath sounds: Normal breath sounds. No stridor. No wheezing, rhonchi or rales.  Chest:     Chest wall: No tenderness.     Breasts:        Right: Normal.        Left: Normal.  Abdominal:     General: Bowel sounds are normal. There is no distension.     Palpations: Abdomen is soft.     Tenderness: There is no abdominal tenderness. There is no guarding or rebound.     Hernia: There is no hernia in the left inguinal area or right  inguinal area.  Genitourinary:    General: Normal vulva.     Urethra: No prolapse.     Vagina: Normal.     Cervix: Normal.     Uterus: Normal.      Adnexa: Right adnexa normal and left adnexa normal.  Musculoskeletal:        General: No deformity. Normal range of motion.     Cervical back: Normal range of motion and neck supple.  Lymphadenopathy:     Cervical: No cervical adenopathy.     Upper Body:     Right upper body: No supraclavicular, axillary or pectoral adenopathy.     Left upper body: No supraclavicular, axillary or pectoral adenopathy.  Skin:    General: Skin is warm  and dry.     Capillary Refill: Capillary refill takes less than 2 seconds.     Coloration: Skin is not pale.     Findings: No rash.  Neurological:     Mental Status: She is alert and oriented to person, place, and time.     Motor: No abnormal muscle tone.     Gait: Gait normal.  Psychiatric:        Mood and Affect: Mood normal.        Speech: Speech normal.        Behavior: Behavior normal.       Fall Risk: Fall Risk  01/01/2020 12/05/2019  Falls in the past year? 1 1  Number falls in past yr: 1 0  Injury with Fall? 0 1  Comment - knot on elbow    Functional Status Survey: Is the patient deaf or have difficulty hearing?: No Does the patient have difficulty seeing, even when wearing glasses/contacts?: No Does the patient have difficulty concentrating, remembering, or making decisions?: No Does the patient have difficulty walking or climbing stairs?: No Does the patient have difficulty dressing or bathing?: No Does the patient have difficulty doing errands alone such as visiting a doctor's office or shopping?: No   Assessment & Plan:    CPE completed today  . USPSTF grade A and B recommendations reviewed with patient; age-appropriate recommendations, preventive care, screening tests, etc discussed and encouraged; healthy living encouraged; see AVS for patient education given to  patient  . Discussed importance of 150 minutes of physical activity weekly, AHA exercise recommendations given to pt in AVS/handout  . Discussed importance of healthy diet:  eating lean meats and proteins, avoiding trans fats and saturated fats, avoid simple sugars and excessive carbs in diet, eat 6 servings of fruit/vegetables daily and drink plenty of water and avoid sweet beverages.    . Recommended pt to do annual eye exam and routine dental exams/cleanings  . Depression, alcohol, fall screening completed as documented above and per flowsheets  . Reviewed Health Maintenance: Health Maintenance  Topic Date Due  . COVID-19 Vaccine (1) Never done  . MAMMOGRAM  03/26/2020  . INFLUENZA VACCINE  04/20/2020  . PAP SMEAR-Modifier  01/01/2023  . TETANUS/TDAP  12/27/2024  . COLONOSCOPY  08/16/2027  . HIV Screening  Completed      ICD-10-CM   1. Adult general medical exam  Z00.00 Cytology - PAP  2. Screening for malignant neoplasm of cervix  Z12.4 Cytology - PAP  3. Encounter for screening mammogram for malignant neoplasm of breast  Z12.31 MM 3D [place] previously done with PCP - reviewed with pt, no concerns and no f/up on blood work needed today.  Delsa Grana, PA-C 01/01/20 3:23 PM  [facility]

## 2020-01-04 LAB — CYTOLOGY - PAP
Comment: NEGATIVE
Diagnosis: NEGATIVE
High risk HPV: NEGATIVE

## 2020-01-08 ENCOUNTER — Ambulatory Visit (INDEPENDENT_AMBULATORY_CARE_PROVIDER_SITE_OTHER): Payer: No Typology Code available for payment source | Admitting: Internal Medicine

## 2020-01-08 ENCOUNTER — Other Ambulatory Visit: Payer: Self-pay

## 2020-01-08 ENCOUNTER — Encounter: Payer: Self-pay | Admitting: Internal Medicine

## 2020-01-08 ENCOUNTER — Encounter: Payer: Self-pay | Admitting: Family Medicine

## 2020-01-08 VITALS — BP 134/82 | HR 87 | Temp 97.5°F | Resp 14 | Ht 64.0 in | Wt 176.9 lb

## 2020-01-08 DIAGNOSIS — Z4802 Encounter for removal of sutures: Secondary | ICD-10-CM

## 2020-01-08 DIAGNOSIS — M1990 Unspecified osteoarthritis, unspecified site: Secondary | ICD-10-CM

## 2020-01-08 DIAGNOSIS — S61213D Laceration without foreign body of left middle finger without damage to nail, subsequent encounter: Secondary | ICD-10-CM | POA: Diagnosis not present

## 2020-01-08 NOTE — Progress Notes (Signed)
Patient ID: Carrie Nichols, female    DOB: [birth year], 54 y.o.   MRN: [id number]  PCP: Jamelle Haring, MD  Chief Complaint  Patient presents with  . Suture / Staple Removal    left middle finger    Subjective:   Carrie Nichols is a 54 y.o. female, presents to clinic with CC of the following:  Chief Complaint  Patient presents with  . Suture / Staple Removal    left middle finger    HPI:  Patient is a 54 y.o. female that presented to the emergency department on 12/28/19 for evaluation of finger laceration.  Patient got her finger caught in the door trying to prevent her chickens from escaping.  Last tetanus was 3 to 4 years ago. Laceration was repaired with stitches.  Finger splint was placed. She did follow-up for a wound check 2 days later, and an x-ray at that time showed a tuft fracture, nondisplaced.  She was put on Keflex and was to continue that antibiotic, and continue to keep the wound clean and dry. Follows up today for suture removal. She notes that it is still sore, especially on 1 side of the half circle laceration on the finger pad, and she still notes that she has a numbness type feeling over that finger pad region.  She was told it may not ever come back prior.  Denied any marked surrounding redness or any purulence coming out of the wound area.  She has been trying to keep the area clean.  She does have a rheumatology appointment scheduled in the near future after our previous visit 12/05/2019, and she also did see a female colleague in the office and have a woman's health evaluation done including a Pap in the very recent past.  Patient Active Problem List   Diagnosis Date Noted  . Other insomnia 12/05/2019  . Arthritis 12/05/2019  . Easy bruising 12/05/2019  . Mitral valve prolapse 05/10/2016  . SVT (supraventricular tachycardia) ([facility]) 05/10/2016      Current Outpatient Medications:  .  acetaminophen (TYLENOL) 500 MG tablet, Take 500 mg  by mouth every 6 (six) hours as needed., Disp: , Rfl:  .  Melatonin 3 MG CAPS, Take by mouth., Disp: , Rfl:  .  metoprolol succinate (TOPROL-XL) 25 MG 24 hr tablet, Take 1 tablet by mouth 1 day or 1 dose., Disp: , Rfl:    Allergies  Allergen Reactions  . Codeine Rash     Past Surgical History:  Procedure Laterality Date  . COLONOSCOPY WITH PROPOFOL N/A 08/15/2017   Procedure: COLONOSCOPY WITH PROPOFOL;  Surgeon: Christena Deem, MD;  Location: [facility] ENDOSCOPY;  Service: Endoscopy;  Laterality: N/A;     Family History  Problem Relation Age of Onset  . Hyperlipidemia Mother   . High blood pressure Mother   . Skin cancer Father   . Colon polyps Father   . Hyperlipidemia Brother   . Ovarian cancer Paternal Grandmother   . Prostate cancer Paternal Grandfather   . Colon cancer Paternal Uncle   . Breast cancer Neg Hx      Social History   Tobacco Use  . Smoking status: Never Smoker  . Smokeless tobacco: Never Used  Substance Use Topics  . Alcohol use: No    With staff assistance, above reviewed with the patient today.  ROS: As per HPI, otherwise no specific complaints on a limited and focused system review   No results found for this or any  previous visit (from the past 72 hour(s)).   PHQ2/9: Depression screen [facility] 2/9 01/08/2020 01/01/2020 12/05/2019  Decreased Interest 0 0 0  Down, Depressed, Hopeless 0 0 0  PHQ - 2 Score 0 0 0  Altered sleeping 0 0 3  Tired, decreased energy 0 0 3  Change in appetite 0 0 0  Feeling bad or failure about yourself  0 0 0  Trouble concentrating 0 0 0  Moving slowly or fidgety/restless 0 0 0  Suicidal thoughts 0 0 0  PHQ-9 Score 0 0 6  Difficult doing work/chores Not difficult at all Not difficult at all Somewhat difficult   PHQ-2/9 Result is neg  Fall Risk: Fall Risk  01/08/2020 01/01/2020 12/05/2019  Falls in the past year? 0 1 1  Number falls in past yr: 0 1 0  Injury with Fall? 0 0 1  Comment - - knot on elbow       Objective:   Vitals:   01/08/20 1413  BP: 134/82  Pulse: 87  Resp: 14  Temp: (!) 97.5 F (36.4 C)  TempSrc: Temporal  SpO2: 97%  Weight: 176 lb 14.4 oz (80.2 kg)  Height: 5\' 4"  (1.626 m)    Body mass index is 30.36 kg/m.  Physical Exam   NAD, masked, pleasant HEENT - [place]/AT, sclera anicteric,  Ext -left 3rd finger- the flap-like laceration to the pad space had the sutures in place, with no marked surrounding erythema or purulent discharge noted.  Some scabbing was noted along the ulnar aspect where the area was more sore.  There is slight decrease in sensation with palpation over the finger pad area.  Dorsal aspect of the digit has normal sensation with 2+ cap refill.   Mild swelling noted at the distal phalanx.  Able to maintain full active extension of the DIP joint, can slightly flex the DIP joint, although still mildly uncomfortable. Neuro/psychiatric - affect was not flat, appropriate with conversation  Alert   Speech  normal       Assessment & Plan:   1. Encounter for removal of sutures 10 sutures were removed today without incident, was a little uncomfortable for her removing the sutures and the scabbed area, The area was then cleansed and loosely covered with a gauze dressing with Melissa's help. To keep the wound clean, with soaks in warm soapy water or cleansing with warm soapy water recommended, and keeping loosely covered if any exposure to dirty environment anticipated. Noted the numbness may slowly improve over time, although it may also persist indefinitely.  2. Laceration of left middle finger without foreign body without damage to nail, subsequent encounter As above, sutures removed.  3. Small tuft fracture  Some mild swelling and discomfort persists, and expect gradual improvement over time  4. Arthritis Keep planned follow-up with rheumatology   Emphasized following up if any signs of infection arising, or if symptoms worsen and are not not gradually  improving over time.    Towanda Malkin, MD 01/08/20 2:23 PM

## 2020-01-08 NOTE — Patient Instructions (Signed)
Wound Care, Adult Taking care of your wound properly can help to prevent pain, infection, and scarring. It can also help your wound to heal more quickly. How to care for your wound Wound care      Follow instructions from your health care provider about how to take care of your wound. Make sure you: ? Wash your hands with soap and water before you change the bandage (dressing). If soap and water are not available, use hand sanitizer. ? Change your dressing as told by your health care provider. ? Leave stitches (sutures), skin glue, or adhesive strips in place. These skin closures may need to stay in place for 2 weeks or longer. If adhesive strip edges start to loosen and curl up, you may trim the loose edges. Do not remove adhesive strips completely unless your health care provider tells you to do that.  Check your wound area every day for signs of infection. Check for: ? Redness, swelling, or pain. ? Fluid or blood. ? Warmth. ? Pus or a bad smell.  Ask your health care provider if you should clean the wound with mild soap and water. Doing this may include: ? Using a clean towel to pat the wound dry after cleaning it. Do not rub or scrub the wound. ? Applying a cream or ointment. Do this only as told by your health care provider. ? Covering the incision with a clean dressing.  Ask your health care provider when you can leave the wound uncovered.  Keep the dressing dry until your health care provider says it can be removed. Do not take baths, swim, use a hot tub, or do anything that would put the wound underwater until your health care provider approves. Ask your health care provider if you can take showers. You may only be allowed to take sponge baths. Medicines   If you were prescribed an antibiotic medicine, cream, or ointment, take or use the antibiotic as told by your health care provider. Do not stop taking or using the antibiotic even if your condition improves.  Take  over-the-counter and prescription medicines only as told by your health care provider. If you were prescribed pain medicine, take it 30 or more minutes before you do any wound care or as told by your health care provider. General instructions  Return to your normal activities as told by your health care provider. Ask your health care provider what activities are safe.  Do not scratch or pick at the wound.  Do not use any products that contain nicotine or tobacco, such as cigarettes and e-cigarettes. These may delay wound healing. If you need help quitting, ask your health care provider.  Keep all follow-up visits as told by your health care provider. This is important.  Eat a diet that includes protein, vitamin A, vitamin C, and other nutrient-rich foods to help the wound heal. ? Foods rich in protein include meat, dairy, beans, nuts, and other sources. ? Foods rich in vitamin A include carrots and dark green, leafy vegetables. ? Foods rich in vitamin C include citrus, tomatoes, and other fruits and vegetables. ? Nutrient-rich foods have protein, carbohydrates, fat, vitamins, or minerals. Eat a variety of healthy foods including vegetables, fruits, and whole grains. Contact a health care provider if:  You received a tetanus shot and you have swelling, severe pain, redness, or bleeding at the injection site.  Your pain is not controlled with medicine.  You have redness, swelling, or pain around the wound.    You have fluid or blood coming from the wound.  Your wound feels warm to the touch.  You have pus or a bad smell coming from the wound.  You have a fever or chills.  You are nauseous or you vomit.  You are dizzy. Get help right away if:  You have a red streak going away from your wound.  The edges of the wound open up and separate.  Your wound is bleeding, and the bleeding does not stop with gentle pressure.  You have a rash.  You faint.  You have trouble  breathing. Summary  Always wash your hands with soap and water before changing your bandage (dressing).  To help with healing, eat foods that are rich in protein, vitamin A, vitamin C, and other nutrients.  Check your wound every day for signs of infection. Contact your health care provider if you suspect that your wound is infected. This information is not intended to replace advice given to you by your health care provider. Make sure you discuss any questions you have with your health care provider. Document Revised: 12/25/2018 Document Reviewed: 03/23/2016 Elsevier Patient Education  2020 [employer]  

## 2020-02-06 ENCOUNTER — Telehealth: Payer: Self-pay

## 2020-02-06 NOTE — Telephone Encounter (Signed)
Copied from CRM [id number]. Topic: General - Other >> Feb 06, 2020 11:40 AM Carrie Nichols wrote: Reason for CRM: Pt called and is requesting to know if there is any blood work that can be done to test her for lymphoma. Please advise.

## 2020-02-08 NOTE — Telephone Encounter (Signed)
Left patient VM to return phone call, sent patient Mychart message with information as well.

## 2020-02-08 NOTE — Telephone Encounter (Signed)
Please let patient know that blood work can be a data point that helps make the diagnosis of lymphoma, but clinical correlation is critical in the evaluation. If she is having concerns, she should make an appointment to be evaluated. Thanks, [facility]

## 2020-02-08 NOTE — Telephone Encounter (Signed)
Pt called and understands information from PCP.

## 2020-02-08 NOTE — Telephone Encounter (Signed)
Patient called, left VM to return the call to the office for lab results.  ?

## 2020-02-26 ENCOUNTER — Encounter: Payer: Self-pay | Admitting: Internal Medicine

## 2020-03-26 ENCOUNTER — Telehealth: Payer: Self-pay

## 2020-03-26 DIAGNOSIS — L918 Other hypertrophic disorders of the skin: Secondary | ICD-10-CM

## 2020-03-26 NOTE — Telephone Encounter (Signed)
Copied from CRM [id number]. Topic: Referral - Request for Referral >> Mar 26, 2020 12:08 PM Gwenlyn Fudge wrote: Has patient seen PCP for this complaint? Yes.   *If NO, is insurance requiring patient see PCP for this issue before PCP can refer them? Referral for which specialty: Dermatology  Preferred provider/office: [facility] Reason for referral: Pt states that she has skin tags and is requesting to have a referral to have them looked at. Please advise.

## 2020-03-26 NOTE — Telephone Encounter (Signed)
Ok to order referral?    Copied from CRM [id number]. Topic: Referral - Request for Referral >> Mar 26, 2020 12:08 PM Gwenlyn Fudge wrote: Has patient seen PCP for this complaint? Yes.   *If NO, is insurance requiring patient see PCP for this issue before PCP can refer them? Referral for which specialty: Dermatology  Preferred provider/office: [facility] Reason for referral: Pt states that she has skin tags and is requesting to have a referral to have them looked at. Please advise.

## 2020-03-26 NOTE — Telephone Encounter (Signed)
Yes, ok for referral. Thanks

## 2020-03-26 NOTE — Addendum Note (Signed)
Addended by: Marta Antu on: 03/26/2020 02:39 PM   Modules accepted: Orders

## 2020-03-27 ENCOUNTER — Telehealth: Payer: Self-pay

## 2020-03-27 NOTE — Telephone Encounter (Signed)
A referral was placed for a referral to dermatology to [facility] but they do not have that specialty so I called this patient to see if there is somewhere else she would like to go but there was no answer.  A CRM will be placed.

## 2020-04-12 ENCOUNTER — Ambulatory Visit: Payer: Managed Care, Other (non HMO) | Attending: Internal Medicine

## 2020-04-12 VITALS — BP 158/84 | HR 71

## 2020-04-12 DIAGNOSIS — Z23 Encounter for immunization: Secondary | ICD-10-CM

## 2020-04-12 NOTE — Progress Notes (Signed)
   Covid-19 Vaccination Clinic  Name:  Carrie Nichols    MRN: [id number] DOB: June 28, 1966  04/12/2020  Carrie Nichols was observed post Covid-19 immunization for 15 minutes .  During the observation period, she experienced an adverse reaction with the following symptoms:  dizziness and anxiousness, tingling all over.  Assessment : Time of assessment 0841. Alert and oriented, Anxious and Patient stated "I was very nervous to come this morning. I do this whenever I get a shot or even go to the doctor.".  Actions taken:  Vitals sign taken  Patient was placed on a stretcher and moved to a private area. Patient immediately stated she felt better.   Vitals:   04/12/20 0842  BP: (!) 158/84  Pulse: 71  SpO2: 99%    Medications administered: No medication administered.  Disposition: Reports no further symptoms of adverse reaction after observation for 20 minutes. Discharged home.   Immunizations Administered    Name Date Dose VIS Date Route   Pfizer COVID-19 Vaccine 04/12/2020  8:38 AM 0.3 mL 11/14/2018 Intramuscular   Manufacturer: [employer], [employer]   Lot: [id number]   NDC: [address]-9

## 2020-05-02 ENCOUNTER — Ambulatory Visit: Payer: Self-pay | Admitting: *Deleted

## 2020-05-02 ENCOUNTER — Encounter: Payer: Self-pay | Admitting: Internal Medicine

## 2020-05-02 NOTE — Telephone Encounter (Signed)
Patient is scheduled on Monday for 2nd dose of Pfizer vaccine and is hesitant because she had a reaction to the 1st dose of elevated HR and clammy skin. Patient would like reassurance and to notify PCP if she should take 2nd dose. Care advise given regarding Covid questions and reactions. Patient verbalized understanding and would like PCP advise.   Reason for Disposition . COVID-19 vaccine, Frequently Asked Questions (FAQs)  Answer Assessment - Initial Assessment Questions 1. MAIN CONCERN OR SYMPTOM:  "What is your main concern right now?" "What question do you have?" "What's the main symptom you're worried about?" (e.g., fever, pain, redness, swelling)     Main concern is the same reaction going to happen as it did after the 1 st Pfizer vaccine? 2. VACCINE: "What vaccination did you receive?" "Is this your first or second shot?" (e.g., none; AstraZeneca, J&J, Moderna, [employer], other)     Pfizer 3. SYMPTOM ONSET: "When did the elevated HR and clammy skin begin?" (e.g., not relevant; hours, days)      immediately after the vaccine was given 4. SYMPTOM SEVERITY: "How bad is it?"      Elevated HR and clammy skin after 1st dose 5. FEVER: "Is there a fever?" If so, ask: "What is it, how was it measured, and when did it start?"      na 6. PAST REACTIONS: "Have you reacted to immunizations before?" If so, ask: "What happened?"     Yes elevated HR and clammy skin, day one fever chills 7. OTHER SYMPTOMS: "Do you have any other symptoms?"     None now  Protocols used: CORONAVIRUS (COVID-19) VACCINE QUESTIONS AND REACTIONS-A-AH

## 2020-05-05 ENCOUNTER — Ambulatory Visit: Payer: Managed Care, Other (non HMO) | Attending: Internal Medicine

## 2020-05-05 ENCOUNTER — Ambulatory Visit: Payer: Managed Care, Other (non HMO)

## 2020-05-05 ENCOUNTER — Ambulatory Visit: Payer: Self-pay

## 2020-05-05 ENCOUNTER — Telehealth: Payer: Self-pay | Admitting: Internal Medicine

## 2020-05-05 DIAGNOSIS — Z23 Encounter for immunization: Secondary | ICD-10-CM

## 2020-05-05 NOTE — Telephone Encounter (Signed)
Patient calling with update. Patient is in ED for her BP which is 200/120. She is requesting call back from clinical staff to discuss.

## 2020-05-05 NOTE — Telephone Encounter (Signed)
Called patient, no answer, left vm. Per Dr. Dorris Fetch it is up to the patient if she prefers to reschedule her appointment for her covid vaccine.

## 2020-05-05 NOTE — Telephone Encounter (Signed)
Patient called and says she's due for her 2nd Pfizer COVID shot today at 1500. She's at work and has a headache that she feels is stress induced. She asked is it ok to take Tylenol or should she reschedule her vaccine. I advised taking pain medication before the vaccines may reduce the body's immune system response to the vaccine, she verbalized understanding. I advised I will send to Dr. Dorris Fetch for review and someone will call with his recommendation. I advised to use her judgement while waiting for a response. She says she will hold off for now and see if the headache will go away on it's own.  Reason for Disposition . [1] Caller requesting NON-URGENT health information AND [2] PCP's office is the best resource  Answer Assessment - Initial Assessment Questions 1. REASON FOR CALL or QUESTION: "What is your reason for calling today?" or "How can I best help you?" or "What question do you have that I can help answer?"     Is it ok to take Tylenol before covid test  Protocols used: INFORMATION ONLY CALL - NO TRIAGE-A-AH

## 2020-05-05 NOTE — Progress Notes (Signed)
   Covid-19 Vaccination Clinic  Name:  Carrie Nichols    MRN: [id number] DOB: July 01, 1966  05/05/2020  Carrie Nichols was observed post Covid-19 immunization for 15 minutes .  During the observation period, she experienced an adverse reaction with the following symptoms:  feeling of pins and needles in her mouth with a metallic taste, and "just don't feel right".  Assessment : Time of assessment 3:40 pm. Alert and oriented and Pallor.  Actions taken:  Vitals sign taken  EMS called at  3:48. VAERS form obtained and completed by S. Shanon Becvar, MSN RN-BC. Hand off to EMS upon arrival at 4:04 pm.  VS: 3:41 pm  BP 169/92  HR 90 O2 Sat 100% 3:45 pm  BP  169/98 HR 86  O2 Sat 98% 3:50 pm  BP  154/87 HR 75  O2 Sat 100% 4:00 pm  BP  167/91 HR 84  O2 Sat 100% 4:04 pm  BP  200/120 manual per EMS  Medications administered: No medication administered.  Disposition: Reports no further symptoms of adverse reaction after observation for 45 minutes. Discharged home. Instructed to follow up with PCP  for evaluation for second dose. Instructed to call 911 for trouble breathing, rapid heart rate, dizziness, swelling of tongue or throat.   Patient was offered to be transported to [facility] per EMS but patient refused and stated she would like to go home to see if she feels better.  Patient contacted family member to pick her up instead of trying to drive herself home.  Patient was discharged with son to home via car.   Immunizations Administered    Name Date Dose VIS Date Route   Pfizer COVID-19 Vaccine 05/05/2020  3:29 PM 0.3 mL 11/14/2018 Intramuscular   Manufacturer: [employer], [employer]   Lot: [id number]   NDC: [address]-9

## 2020-05-06 NOTE — Telephone Encounter (Signed)
Offered patient an appointment, she declined at this time. She will monitor her blood pressure and take tylenol for her headache. She is at work today and feels she is recovering. If anything changes she will call to make an appointment.

## 2020-05-06 NOTE — Telephone Encounter (Signed)
I called the patient, no answer, left vm for her to return call.

## 2020-05-06 NOTE — Telephone Encounter (Signed)
Patient returned call, she got the second vaccine and right after she got it her BP went up to 200/120. The location had to call EMS to evaluate her. She did not go to ED because they told her there was a 15 hour wait.  Her son come and picked her up and took her home. This morning her BP was 154/85 and she has a strong headache since last night. Her blood pressure was high after her first Covid vaccine.

## 2020-05-06 NOTE — Telephone Encounter (Signed)
Patient has also put a message in to her Cardiologist to let them know.

## 2020-05-06 NOTE — Telephone Encounter (Signed)
Happy to see her if she desires. Would recommend tylenol products for her headaches, and encouraged that her BP is much better this morning. [facility]

## 2020-05-12 ENCOUNTER — Telehealth: Payer: Self-pay | Admitting: *Deleted

## 2020-05-12 NOTE — Telephone Encounter (Signed)
Called patient and left a voicemail in regards to the second COVID vaccine that she received on 05/05/20 to see how she was doing. Will attempt to call again.

## 2020-05-12 NOTE — Telephone Encounter (Signed)
Patient called back and stated that when she got the first vaccine she had tingling in her mouth and didn't feel well. She stated that they laid her down and checked her vitals and her BP was elevated. She was not given any medication and was sent home. When she received her second vaccine she experienced tingling in her mouth and a metallic taste. She did not feel well afterwards and was taken to lie down. EMS was called and her blood pressure was elevated. They wanted to take her to the hospital to be evaluated but she refused and went home. No medication was administered. Patient states that her BP has been elevated since then and she has to take an extra 1/2 of her metoprolol in the morning to assist with her BP and it is slowly going down. She denies any issues with breathing or hives or other signs of anaphylaxis other than the tingling in the mouth and some chest tightness. Please advise.

## 2020-05-13 NOTE — Telephone Encounter (Signed)
Called and spoke with the patient and advised. Patient verbalized understanding and stated that she probably will not get any future boosters.

## 2020-05-13 NOTE — Telephone Encounter (Signed)
None of her symptoms are anaphylactic in nature.  In fact, anaphylaxis would more likely lead to hypotension rather than hypertension.  I do not think testing would answer any other questions we would be asking.  I would just recommend that she be prepared for transient hypertension at the next injection.  She could try premedicating with an antihistamine before, but again the not sure will help.  Malachi Bonds, MD [facility]

## 2020-05-14 ENCOUNTER — Other Ambulatory Visit: Payer: Managed Care, Other (non HMO)

## 2020-05-19 ENCOUNTER — Ambulatory Visit: Payer: Managed Care, Other (non HMO)

## 2020-06-19 ENCOUNTER — Encounter: Payer: Self-pay | Admitting: Internal Medicine

## 2020-07-17 ENCOUNTER — Ambulatory Visit: Payer: Managed Care, Other (non HMO) | Admitting: Dermatology

## 2020-07-22 ENCOUNTER — Other Ambulatory Visit: Payer: Self-pay

## 2020-07-22 ENCOUNTER — Ambulatory Visit
Admission: RE | Admit: 2020-07-22 | Discharge: 2020-07-22 | Disposition: A | Discharge status: Home / Self Care | Payer: Managed Care, Other (non HMO) | Source: Ambulatory Visit | Attending: Family Medicine | Admitting: Family Medicine

## 2020-07-22 DIAGNOSIS — Z1231 Encounter for screening mammogram for malignant neoplasm of breast: Secondary | ICD-10-CM | POA: Insufficient documentation

## 2020-08-20 ENCOUNTER — Ambulatory Visit: Payer: No Typology Code available for payment source | Admitting: Internal Medicine

## 2020-08-28 ENCOUNTER — Ambulatory Visit (INDEPENDENT_AMBULATORY_CARE_PROVIDER_SITE_OTHER): Payer: No Typology Code available for payment source | Admitting: Internal Medicine

## 2020-08-28 ENCOUNTER — Encounter: Payer: Self-pay | Admitting: Internal Medicine

## 2020-08-28 ENCOUNTER — Other Ambulatory Visit: Payer: Self-pay

## 2020-08-28 VITALS — BP 146/88 | HR 82 | Temp 98.2°F | Resp 16 | Ht 64.0 in | Wt 171.9 lb

## 2020-08-28 DIAGNOSIS — I341 Nonrheumatic mitral (valve) prolapse: Secondary | ICD-10-CM | POA: Diagnosis not present

## 2020-08-28 DIAGNOSIS — I471 Supraventricular tachycardia, unspecified: Secondary | ICD-10-CM

## 2020-08-28 DIAGNOSIS — R233 Spontaneous ecchymoses: Secondary | ICD-10-CM

## 2020-08-28 DIAGNOSIS — R030 Elevated blood-pressure reading, without diagnosis of hypertension: Secondary | ICD-10-CM

## 2020-08-28 DIAGNOSIS — R238 Other skin changes: Secondary | ICD-10-CM

## 2020-08-28 DIAGNOSIS — R739 Hyperglycemia, unspecified: Secondary | ICD-10-CM

## 2020-08-28 DIAGNOSIS — M1990 Unspecified osteoarthritis, unspecified site: Secondary | ICD-10-CM

## 2020-08-28 NOTE — Progress Notes (Signed)
Patient ID: Carrie Nichols, female    DOB: [birth year], 54 y.o.   MRN: [id number]  PCP: Jamelle Haring, MD  Chief Complaint  Patient presents with  . Follow-up    Eye exam/labs/bp check    Subjective:   Carrie Nichols is a 54 y.o. female, presents to clinic with CC of the following:  Chief Complaint  Patient presents with  . Follow-up    Eye exam/labs/bp check    HPI:  Patient is a 54 year old female Established with this practice in March 2021 Communication on the lab results after that visit included the following:  The thyroid screen (TSH) was normal. The complete blood count was also normal. The complete metabolic panel had a slightly high glucose at 116, with the remainder of the panel within normal limits and included electrolytes, kidney function and liver function tests, and calcium level. This was not a fasting blood sugar, and will currently monitor. The lipid panel was good, with the cholesterol 166, triglycerides 119, and the LDL cholesterol (lousy type) was 98 (desired less than 100). The HDL cholesterol (healthy type) was just slightly below the desired range of 50 or higher and was 46. Increasing physical activity level can help increase this. The hepatitis C antibody was nonreactive. The urinalysis was also good, all negative except a trace ketones noted. The rheumatologic screening tests are still pending. On a follow-up, we can check a hemoglobin A1c which is a better screening test for diabetes, and await the results of the rheumatologic screening test to help in best timing of follow-up.  The ANA screen did return positive and she was referred to rheumatology, follow-up recommended with [place] in 6 months.   She was seen in June by rheumatology with the following assessment/plan:   Assessment and plan  [name] is a 54 y.o. female who presented for evaluation.  1. Primary osteoarthritis of both hands DIPs enlargement in all  fingers. No pain/ no swelling across PIPs or MCPs, no swelling or pain across wrists, no palpable rheumatoid nodules, no swan-neck or boutonniere deformities, no ulnar deviation of the fingers, no subluxation of MCPs Physical exam is consistent with primary hand osteoarthritis. Normal rheumatoid factor and anti-CCP. Patient was educated on physical symptoms of active rheumatoid arthritis  2. ANA positive History is negative for :recurrent mouth ulcers/scalp or hair alopecia, malar or discoid rash Raynaud's manifestation,sicca symptoms. No history of kidney failure, no history of serositis, no clotting history ,no constitutional symptoms. History is positive for photosensitivity only. - ANA - [facility]  She will contact us with any difficulties with the medications prescribed (AVS summary). Follow Up: The patient will return to [facility] for FOLLOW UP 6 Mo  She has not followed up with rheumatology since, has a f/u on 12/27,   Follows up today  She noted her BP elevated after the first Covid vaccine, and after the second dose, BP really went up and EMS called. Did come down some and has followed up with cardiology and told to take 1.5 pills of the metoprolol, went back to regular dose about a month ago. She noted she has no history of higher blood pressures in her past, and if anything tended to run on the lower side.  She thinks her next follow-up with cardiology is in May 2022, a year after her last visit.  Has not checked in a couple weeks Last check at home was 140/80.   No CP, still has palp's and  followed by cardiology, has HA"s and thinks due to vision. Had eye appt last week and was told looked like a patient with HTN or DM. rec'ed f/u to get BP checked and blood work, glasses changed dramatically, just got today and can see now. She denies any increased thirst or increased urination.  Denies overwhelming fatigue.  She notes little purple dots that arise, and come and  go. Notes can bruise easy at times.  No black or dark stools, no BRBPR    She was last seen by cardiology 01/21/2020 with the following assessment/plan:  Assessment  54 y.o. female with  Encounter Diagnoses  Name Primary?  . SVT (supraventricular tachycardia) (CMS-HCC) Yes  . Mitral valve prolapse   Plan  -No further cardiovascular intervention and/or medication additions due to only minimal abnormality of cholesterol levels and low 10 year cardiovascular risk score. The patient will continue healthy lifestyle measures to achieve appropriate cardiovascular risk reduction. -metoprolol for heart rate control and/or Prevention of tachycardia -Continue to monitor mitral valve prolapse very closely for any new onset of symptoms including palpitations and irregular heartbeat atrial fibrillation and preventricular contractions and shortness of breath. The patient is currently not required to use antibiotics with teeth cleanings or other intervention. -The patient has been instructed to adhere to improving healthy lifestyle. We have specifically addressed the most important factors including abstinence of tobacco use, regular physical activity including the possibility of exercise prescription, healthy diet, and maintaining an appropriate body mass index. -We have had a discussion of the many causes of chest discomfort and or noncardiac angina. It is suggested at this time that we may be able to pursue noncardiac evaluation and treatment Follow-up recommended again in 1 year     Patient Active Problem List   Diagnosis Date Noted  . Other insomnia 12/05/2019  . Arthritis 12/05/2019  . Easy bruising 12/05/2019  . Mitral valve prolapse 05/10/2016  . SVT (supraventricular tachycardia) (HCC) 05/10/2016      Current Outpatient Medications:  .  acetaminophen (TYLENOL) 500 MG tablet, Take 500 mg by mouth every 6 (six) hours as needed., Disp: , Rfl:  .  Melatonin 3 MG CAPS, Take by mouth. (Patient  not taking: Reported on 08/28/2020), Disp: , Rfl:  .  metoprolol succinate (TOPROL-XL) 25 MG 24 hr tablet, Take 1 tablet by mouth 1 day or 1 dose., Disp: , Rfl:    Allergies  Allergen Reactions  . Codeine Rash     Past Surgical History:  Procedure Laterality Date  . COLONOSCOPY WITH PROPOFOL N/A 08/15/2017   Procedure: COLONOSCOPY WITH PROPOFOL;  Surgeon: Christena Deem, MD;  Location: [facility] ENDOSCOPY;  Service: Endoscopy;  Laterality: N/A;     Family History  Problem Relation Age of Onset  . Hyperlipidemia Mother   . High blood pressure Mother   . Skin cancer Father   . Colon polyps Father   . Hyperlipidemia Brother   . Ovarian cancer Paternal Grandmother   . Prostate cancer Paternal Grandfather   . Colon cancer Paternal Uncle   . Breast cancer Neg Hx      Social History   Tobacco Use  . Smoking status: Never Smoker  . Smokeless tobacco: Never Used  Substance Use Topics  . Alcohol use: No    With staff assistance, above reviewed with the patient today.  ROS: As per HPI, otherwise no specific complaints on a limited and focused system review   No results found for this or any previous visit (from  the past 72 hour(s)).   PHQ2/9: Depression screen PHQ 2/9 08/28/2020 01/08/2020 01/01/2020 3/17/2021[facility]  Decreased Interest 0 0 0 0  Down, Depressed, Hopeless 0 0 0 0  PHQ - 2 Score 0 0 0 0  Altered sleeping - 0 0 3  Tired, decreased energy - 0 0 3  Change in appetite - 0 0 0  Feeling bad or failure about yourself  - 0 0 0  Trouble concentrating - 0 0 0  Moving slowly or fidgety/restless - 0 0 0  Suicidal thoughts - 0 0 0  PHQ-9 Score - 0 0 6  Difficult doing work/chores - Not difficult at all Not difficult at all Somewhat difficult   PHQ-2/9 Result is neg  Fall Risk: Fall Risk  08/28/2020 01/08/2020 01/01/2020 12/05/2019  Falls in the past year? 1 0 1 1  Number falls in past yr: 1 0 1 0  Injury with Fall? 0 0 0 1  Comment - - - knot on elbow      Objective:    Vitals:   08/28/20 1444  BP: (!) 146/88  Pulse: 82  Resp: 16  Temp: 98.2 F (36.8 C)  TempSrc: Oral  SpO2: 99%  Weight: 171 lb 14.4 oz (78 kg)  Height: 5\' 4"  (1.626 m)    Body mass index is 29.51 kg/m. Recheck of her blood pressure by myself was 150/85 on the left  Physical Exam   NAD, masked, pleasant,  HEENT - [place]/AT, sclera anicteric, PERRL, EOMI, conj - non-inj'ed, pharynx clear Neck - supple, no adenopathy, no TM, carotids 2+ and = without bruits bilat Car - RRR without m/g/r Pulm- RR and effort normal at rest, CTA without wheeze or rales Abd - soft, NT diffusely, ND, Back - no CVA tenderness,  Skin-no marked bruising in the distal upper extremities, + tattoos on distal upper ext  Ext - no LE edema,  Neuro/psychiatric - affect was not flat, appropriate with conversation             Alert with normal speech             Grossly non-focal    Results for orders placed or performed in visit on 01/01/20  Cytology - PAP  Result Value Ref Range   High risk HPV Negative    Adequacy      Satisfactory for evaluation; transformation zone component PRESENT.   Diagnosis      - Negative for intraepithelial lesion or malignancy (NILM)   Comment Normal Reference Range HPV - Negative    Last labs reviewed Assessment & Plan:   Arthritis Was seen by rheumatology, with the repeat ANA apparently negative per the patient's report. She was felt to have osteoarthritis, with their previous note reviewed. She has a follow-up again with them later this month.  SVT (supraventricular tachycardia) (HCC)  Still followed [facility] Ray[facility].  Their last note was reviewed as well, with a plan to follow-up again in May 2022  Mitral valve prolapse As above  Easy bruising She noted this previously, with a CBC at that time okay.  Platelets were normal. We will recheck a CBC again today. Also will check a metabolic panel as we will check the liver function tests appropriate in  addition to the other parts of that panel.  Hyperglycemia Her last blood sugar was slightly high, and felt an A1c may be good on the follow-up to check. We will check an A1c today and his sugar again through  the complete metabolic panel.  Increased blood pressure without the diagnosis of hypertension Her blood pressures were significantly elevated after the Covid vaccinations, and noted hard to put a cause-and-effect on that presently.  She initially had her metoprolol dose increased to 1-1/2 tablets per cardiology, and then with her pressure better controlled she returned back to 1 tablet daily. Her blood pressure was a little bit higher today, and do feel returning to the 1.5 tabs daily is appropriate at this time. She will check her blood pressures at home at different times of the day and write them down, and that will be helpful to review on her follow-up visit. If her blood pressures are higher at home with any regularity, she needs to follow-up sooner than the planned follow-up in 1 month's time.  Apparently, she had some vascular changes noted through the eye doctor's appointment, and continued follow-up with her eye doctor is recommended.  She just got her new glasses today, and notes it is significantly improved from previous  Await the lab results today, and follow-up in a month's time, sooner as needed.   Jamelle Haring, MD 08/28/20 2:52 PM

## 2020-08-28 NOTE — Patient Instructions (Signed)
Increase the metoprolol to 1.5 tablets daily  Please try to check BP's at home.

## 2020-08-29 LAB — CBC WITH DIFFERENTIAL/PLATELET
Absolute Monocytes: 482 cells/uL (ref 200–950)
Basophils Absolute: 80 cells/uL (ref 0–200)
Basophils Relative: 1.2 %
Eosinophils Absolute: 449 cells/uL (ref 15–500)
Eosinophils Relative: 6.7 %
HCT: 37.5 % (ref 35.0–45.0)
Hemoglobin: 12.4 g/dL (ref 11.7–15.5)
Lymphs Abs: 2633 cells/uL (ref 850–3900)
MCH: 28.4 pg (ref 27.0–33.0)
MCHC: 33.1 g/dL (ref 32.0–36.0)
MCV: 86 fL (ref 80.0–100.0)
MPV: 10.8 fL (ref 7.5–12.5)
Monocytes Relative: 7.2 %
Neutro Abs: 3055 cells/uL (ref 1500–7800)
Neutrophils Relative %: 45.6 %
Platelets: 314 10*3/uL (ref 140–400)
RBC: 4.36 10*6/uL (ref 3.80–5.10)
RDW: 12.6 % (ref 11.0–15.0)
Total Lymphocyte: 39.3 %
WBC: 6.7 10*3/uL (ref 3.8–10.8)

## 2020-08-29 LAB — COMPLETE METABOLIC PANEL WITHOUT GFR
AG Ratio: 1.7 (calc) (ref 1.0–2.5)
ALT: 17 U/L (ref 6–29)
AST: 14 U/L (ref 10–35)
Albumin: 4.3 g/dL (ref 3.6–5.1)
Alkaline phosphatase (APISO): 60 U/L (ref 37–153)
BUN: 15 mg/dL (ref 7–25)
CO2: 29 mmol/L (ref 20–32)
Calcium: 9.3 mg/dL (ref 8.6–10.4)
Chloride: 105 mmol/L (ref 98–110)
Creat: 0.85 mg/dL (ref 0.50–1.05)
GFR, Est African American: 90 mL/min/1.73m2 (ref >=60)
GFR, Est Non African American: 78 mL/min/1.73m2 (ref >=60)
Globulin: 2.6 g/dL (ref 1.9–3.7)
Glucose, Bld: 90 mg/dL (ref 65–99)
Potassium: 4 mmol/L (ref 3.5–5.3)
Sodium: 140 mmol/L (ref 135–146)
Total Bilirubin: 0.4 mg/dL (ref 0.2–1.2)
Total Protein: 6.9 g/dL (ref 6.1–8.1)

## 2020-08-29 LAB — HEMOGLOBIN A1C
Hgb A1c MFr Bld: 5.6 %{Hb} (ref <5.7)
Mean Plasma Glucose: 114 mg/dL
eAG (mmol/L): 6.3 mmol/L

## 2020-09-15 ENCOUNTER — Encounter: Payer: Self-pay | Admitting: Internal Medicine

## 2020-09-23 ENCOUNTER — Encounter: Payer: Self-pay | Admitting: Internal Medicine

## 2020-09-24 NOTE — Progress Notes (Signed)
Patient ID: Carrie Nichols, female    DOB: 02/21/1966, 55 y.o.   MRN: [id number]  PCP: Jamelle Haring, MD  Chief Complaint  Patient presents with  . Hypertension  . Knee Pain    Left knee pain x 2 weeks. Knee is aching, swollen, and throbbing constantly. Unsure if she injured it.     Subjective:   Carrie Nichols is a 55 y.o. female, presents to clinic with CC of the following:  Chief Complaint  Patient presents with  . Hypertension  . Knee Pain    Left knee pain x 2 weeks. Knee is aching, swollen, and throbbing constantly. Unsure if she injured it.     HPI:  Patient is a 55 year old female Established with this practice in March 2021, with her last visit 08/28/2020 Follow-up communication after the lab results were obtained after the last visit was as follows:  The lab results from yesterday were reviewed. Very good news. The complete metabolic panel, a1c and complete blood count were all normal Follows up today to recheck her blood pressure and also has developed a knee concern   She noted over the Christmas holiday, she was doing a lot of yard work, up and down, often kneeling at times.  In the middle of the night, she started to feel some right knee pain, actually the pain was below the knee.  A raised area of swelling was present with the area often feeling hot for a few days, and painful.  Bothers her some with knee movements.  She had no pains over the knee joint proper.  Had no one-time trauma she could recall.  She iced the area several times the last couple days, and thinks that has been helpful.   Arthritis hx Was seen by rheumatology, with the repeat ANA apparently negative per the patient's report. She was felt to have osteoarthritis, with their previous note reviewed. She had a follow-up again 09/15/2020 scheduled, although did not have that follow-up visit.  Elevated blood pressure concern  - she noted her BP elevated after the first  Covid vaccine, and after the second dose, BP really went up and EMS called. Did come down some and has followed up with cardiology and told to take 1.5 pills of the metoprolol, went back to regular dose before our last visit, and then recommended increasing again to 1.5 pills daily. She noted she has no history of higher blood pressures in her past, and if anything tended to run on the lower side.  She thinks her next follow-up with cardiology is in May 2022, a year after her last visit. She has been checking her blood pressures at home with an omicron machine, and notes they have been running in the 130s over 70s predominantly, was 138/72 this morning.  She notes the highest has been 144/80. He denies any chest pains, increased palpitations, shortness of breath, increased lower extremity swelling  BP Readings from Last 3 Encounters:  08/28/20 (!) 146/88  04/12/20 (!) 158/84  01/08/20 134/82    Had eye appt the week prior to our last appointment and was told looked like a patient with HTN or DM.    She was last seen by cardiology 01/21/2020 with the following assessment/plan:             Assessment  55 y.o. female with  Encounter Diagnoses  Name Primary?  . SVT (supraventricular tachycardia) (CMS-HCC) Yes  . Mitral valve prolapse  Plan  -No further cardiovascular intervention and/or medication additions due to only minimal abnormality of cholesterol levels and low 10 year cardiovascular risk score. The patient will continue healthy lifestyle measures to achieve appropriate cardiovascular risk reduction. -metoprolol for heart rate control and/or Prevention of tachycardia -Continue to monitor mitral valve prolapse very closely for any new onset of symptoms including palpitations and irregular heartbeat atrial fibrillation and preventricular contractions and shortness of breath. The patient is currently not required to use antibiotics with teeth cleanings or other  intervention. -The patient has been instructed to adhere to improving healthy lifestyle. We have specifically addressed the most important factors including abstinence of tobacco use, regular physical activity including the possibility of exercise prescription, healthy diet, and maintaining an appropriate body mass index. -We have had a discussion of the many causes of chest discomfort and or noncardiac angina. It is suggested at this time that we may be able to pursue noncardiac evaluation and treatment Follow-up recommended again in 1 year   Patient Active Problem List   Diagnosis Date Noted  . Elevated BP without diagnosis of hypertension 08/28/2020  . Other insomnia 12/05/2019  . Arthritis 12/05/2019  . Easy bruising 12/05/2019  . Mitral valve prolapse 05/10/2016  . SVT (supraventricular tachycardia) ([place]) 05/10/2016      Current Outpatient Medications:  .  acetaminophen (TYLENOL) 500 MG tablet, Take 500 mg by mouth every 6 (six) hours as needed., Disp: , Rfl:  .  Melatonin 3 MG CAPS, Take by mouth. (Patient not taking: Reported on 08/28/2020), Disp: , Rfl:  .  metoprolol succinate (TOPROL-XL) 25 MG 24 hr tablet, Take 1 tablet by mouth 1 day or 1 dose., Disp: , Rfl:    Allergies  Allergen Reactions  . Codeine Rash     Past Surgical History:  Procedure Laterality Date  . COLONOSCOPY WITH PROPOFOL N/A 08/15/2017   Procedure: COLONOSCOPY WITH PROPOFOL;  Surgeon: Lollie Sails, MD;  Location: [facility] ENDOSCOPY;  Service: Endoscopy;  Laterality: N/A;     Family History  Problem Relation Age of Onset  . Hyperlipidemia Mother   . High blood pressure Mother   . Skin cancer Father   . Colon polyps Father   . Hyperlipidemia Brother   . Ovarian cancer Paternal Grandmother   . Prostate cancer Paternal Grandfather   . Colon cancer Paternal Uncle   . Breast cancer Neg Hx      Social History   Tobacco Use  . Smoking status: Never Smoker  . Smokeless tobacco: Never Used   Substance Use Topics  . Alcohol use: No    With staff assistance, above reviewed with the patient today.  ROS: As per HPI, otherwise no specific complaints on a limited and focused system review   No results found for this or any previous visit (from the past 72 hour(s)).   PHQ2/9: Depression screen [facility] 2/9 09/25/2020 08/28/2020 01/08/2020 01/01/2020 12/05/2019  Decreased Interest 0 0 0 0 0  Down, Depressed, Hopeless 0 0 0 0 0  PHQ - 2 Score 0 0 0 0 0  Altered sleeping - - 0 0 3  Tired, decreased energy - - 0 0 3  Change in appetite - - 0 0 0  Feeling bad or failure about yourself  - - 0 0 0  Trouble concentrating - - 0 0 0  Moving slowly or fidgety/restless - - 0 0 0  Suicidal thoughts - - 0 0 0  PHQ-9 Score - - 0 0 6  Difficult doing work/chores - - Not difficult at all Not difficult at all Somewhat difficult   PHQ-2/9 Result is neg  Fall Risk: Fall Risk  09/25/2020 08/28/2020 01/08/2020 01/01/2020 12/05/2019  Falls in the past year? 0 1 0 1 1  Number falls in past yr: 0 1 0 1 0  Injury with Fall? 0 0 0 0 1  Comment - - - - knot on elbow      Objective:   Vitals:   09/25/20 1502  Resp: 16  Temp: 98.9 F (37.2 C)  TempSrc: Oral  SpO2: 99%  Weight: 174 lb 12.8 oz (79.3 kg)  Height: 5\' 4"  (1.626 m)    Body mass index is 30 kg/m.  Physical Exam  Blood pressure slightly elevated on initial checks and not orthostatic on checks today. Recheck by myself in the office was 150/84 on the left with an adult cuff.  NAD, masked, pleasant,  HEENT - [place]/AT, sclera anicteric, positive glasses, PERRL, conj - non-inj'ed,  Neck - supple,  carotids 2+ and = without bruits bilat Car - RRR without m/g/r Pulm- RR and effort normal at rest, CTA without wheeze or rales Ext -right knee-nontender with movement of the patella, no effusion present, nontender at the medial and lateral joint lines.  Nontender posteriorly.  Motion was adequate The knee the knee joint proper overlying the tibial  tuberosity was a raised circular swelling slightly less than golf ball sized that was very tender to palpation, and tender immediately surrounding the slightly raised area to palpation, no marked erythema present, not warm to the touch today, nontender further distally palpating over the anterior tibia. Neuro/psychiatric - affect was not flat, appropriate with conversation Alert with normal speech Grossly non-focal   Results for orders placed or performed in visit on 08/28/20  CBC with Differential/Platelet  Result Value Ref Range   WBC 6.7 3.8 - 10.8 Thousand/uL   RBC 4.36 3.80 - 5.10 Million/uL   Hemoglobin 12.4 11.7 - 15.5 g/dL   HCT 14/09/21 53.6 - 14.4 %   MCV 86.0 80.0 - 100.0 fL   MCH 28.4 27.0 - 33.0 pg   MCHC 33.1 32.0 - 36.0 g/dL   RDW 31.5 40.0 - 86.7 %   Platelets 314 140 - 400 Thousand/uL   MPV 10.8 7.5 - 12.5 fL   Neutro Abs 3,055 1,500 - 7,800 cells/uL   Lymphs Abs 2,633 850 - 3,900 cells/uL   Absolute Monocytes 482 200 - 950 cells/uL   Eosinophils Absolute 449 15 - 500 cells/uL   Basophils Absolute 80 0 - 200 cells/uL   Neutrophils Relative % 45.6 %   Total Lymphocyte 39.3 %   Monocytes Relative 7.2 %   Eosinophils Relative 6.7 %   Basophils Relative 1.2 %  Hemoglobin A1c  Result Value Ref Range   Hgb A1c MFr Bld 5.6 <5.7 % of total Hgb   Mean Plasma Glucose 114 mg/dL   eAG (mmol/L) 6.3 mmol/L  COMPLETE METABOLIC PANEL WITH GFR  Result Value Ref Range   Glucose, Bld 90 65 - 99 mg/dL   BUN 15 7 - 25 mg/dL   Creat [birth year]1994 5.09 - 3.26 mg/dL   GFR, Est Non African American 78 > OR = 60 mL/min/1.63m2   GFR, Est African American 90 > OR = 60 mL/min/1.38m2   BUN/Creatinine Ratio NOT APPLICABLE 6 - 22 (calc)   Sodium 140 135 - 146 mmol/L   Potassium 4.0 3.5 - 5.3 mmol/L   Chloride 105 98 -  110 mmol/L   CO2 29 20 - 32 mmol/L   Calcium 9.3 8.6 - 10.4 mg/dL   Total Protein 6.9 6.1 - 8.1 g/dL   Albumin 4.3 3.6 - 5.1 g/dL   Globulin 2.6 1.9 - 3.7  g/dL (calc)   AG Ratio 1.7 1.0 - 2.5 (calc)   Total Bilirubin 0.4 0.2 - 1.2 mg/dL   Alkaline phosphatase (APISO) 60 37 - 153 U/L   AST 14 10 - 35 U/L   ALT 17 6 - 29 U/L   Last labs reviewed    Assessment & Plan:   1. Acute pain of right knee Educated patient today on the diagnosis, soft tissue swelling above the tibial tuberosity likely due to her increased kneeling and yard work prior to the onset later that evening.  No effusion or concerns for injury to the knee joint proper. Recommended continuing to apply ice topically fairly liberally over the next few days at 10 to 15-minute intervals, and not to apply heat. Also recommended to avoid kneeling, avoid a lot of bending and up-and-down activities in the short-term until this improves. Can use some over-the-counter Tylenol or ibuprofen type products as needed for discomfort. Do expect this to gradually improve over the next few days into next week, and if not improving or more problematic, needs to follow-up. Also if more redness starts to develop surrounding this area she needs to follow-up as well.  2. Primary hypertension Do feel her blood pressure is fairly well controlled on the 1.5 tabs of the metoprolol product presently.  The metoprolol helps with her SVT concerns and is followed by cardiology presently. Felt best continuing to monitor presently, and she will continue to check blood pressures at home to help monitor as well.  If the top number is consistently over 140, or the bottom number consistently over 90, she needs to follow-up sooner than that planned follow-up.  Felt best having another follow-up in a couple months time to reassess, and follow-up sooner as needed.  She is aware that her follow-up will be with a new provider as I will be leaving this practice prior to that planned follow-up.       Jamelle Haring, MD 09/25/20 3:08 PM

## 2020-09-25 ENCOUNTER — Other Ambulatory Visit: Payer: Self-pay

## 2020-09-25 ENCOUNTER — Encounter: Payer: Self-pay | Admitting: Internal Medicine

## 2020-09-25 ENCOUNTER — Ambulatory Visit (INDEPENDENT_AMBULATORY_CARE_PROVIDER_SITE_OTHER): Payer: Commercial Managed Care - HMO | Admitting: Internal Medicine

## 2020-09-25 VITALS — Temp 98.9°F | Resp 16 | Ht 64.0 in | Wt 174.8 lb

## 2020-09-25 DIAGNOSIS — M25561 Pain in right knee: Secondary | ICD-10-CM | POA: Diagnosis not present

## 2020-09-25 DIAGNOSIS — I10 Essential (primary) hypertension: Secondary | ICD-10-CM | POA: Insufficient documentation

## 2020-09-25 NOTE — Patient Instructions (Signed)
Continue to take 1.5 tablets of the metoprolol and continue to monitor blood pressures at home.  Recommend applying cold topically to the mild swelling below the knee fairly liberally at 10 to 15-minute intervals.

## 2020-09-26 ENCOUNTER — Telehealth: Payer: Self-pay

## 2020-09-26 ENCOUNTER — Encounter: Payer: Self-pay | Admitting: Internal Medicine

## 2020-09-26 NOTE — Telephone Encounter (Signed)
Please have her continue to monitor presently.

## 2020-09-26 NOTE — Telephone Encounter (Signed)
Copied from CRM [id number]. Topic: General - Other >> Sep 26, 2020  9:12 AM Lyn Hollingshead D wrote: PT calling to update her blood pressure Reading 150 / 78 / please advise

## 2020-10-14 ENCOUNTER — Telehealth: Payer: Managed Care, Other (non HMO) | Admitting: Emergency Medicine

## 2020-10-14 ENCOUNTER — Encounter: Payer: Self-pay | Admitting: Internal Medicine

## 2020-10-14 DIAGNOSIS — J329 Chronic sinusitis, unspecified: Secondary | ICD-10-CM | POA: Diagnosis not present

## 2020-10-14 MED ORDER — AMOXICILLIN-POT CLAVULANATE 875-125 MG PO TABS
1.0000 | ORAL_TABLET | Freq: Two times a day (BID) | ORAL | 0 refills | Status: DC
Start: 2020-10-14 — End: 2021-02-10

## 2020-10-14 NOTE — Progress Notes (Signed)
We are sorry that you are not feeling well.  Here is how we plan to help!  Based on what you have shared with me it looks like you have sinusitis.  Sinusitis is inflammation and infection in the sinus cavities of the head.  Based on your presentation I believe you most likely have Acute Bacterial Sinusitis.  This is an infection caused by bacteria and is treated with antibiotics. I have prescribed Augmentin to be taken twice daily for 7 days. You may also use an oral decongestant such as Mucinex D or if you have glaucoma or high blood pressure use plain Mucinex. Saline nasal spray help and can safely be used as often as needed for congestion.  If you develop worsening sinus pain, fever or notice severe headache and vision changes, or if symptoms are not better after completion of antibiotic, please schedule an appointment with a health care provider.    Sinus infections are not as easily transmitted as other respiratory infection, however we still recommend that you avoid close contact with loved ones, especially the very young and elderly.  Remember to wash your hands thoroughly throughout the day as this is the number one way to prevent the spread of infection!  Home Care:  Only take medications as instructed by your medical team.  Complete the entire course of an antibiotic.  Do not take these medications with alcohol.  A steam or ultrasonic humidifier can help congestion.  You can place a towel over your head and breathe in the steam from hot water coming from a faucet.  Avoid close contacts especially the very young and the elderly.  Cover your mouth when you cough or sneeze.  Always remember to wash your hands.  Get Help Right Away If:  You develop worsening fever or sinus pain.  You develop a severe head ache or visual changes.  Your symptoms persist after you have completed your treatment plan.  Make sure you  Understand these instructions.  Will watch your  condition.  Will get help right away if you are not doing well or get worse.  Your e-visit answers were reviewed by a board certified advanced clinical practitioner to complete your personal care plan.  Depending on the condition, your plan could have included both over the counter or prescription medications.  If there is a problem please reply  once you have received a response from your provider.  Your safety is important to us.  If you have drug allergies check your prescription carefully.    You can use MyChart to ask questions about today's visit, request a non-urgent call back, or ask for a work or school excuse for 24 hours related to this e-Visit. If it has been greater than 24 hours you will need to follow up with your provider, or enter a new e-Visit to address those concerns.  You will get an e-mail in the next two days asking about your experience.  I hope that your e-visit has been valuable and will speed your recovery. Thank you for using e-visits.  Approximately 5 minutes was used in reviewing the patient's chart, questionnaire, prescribing medications, and documentation.   

## 2020-11-10 ENCOUNTER — Telehealth: Payer: Self-pay

## 2020-11-10 NOTE — Telephone Encounter (Signed)
Called pt to discuss she wanted to update [place] on bp and cardio.  Meds updated

## 2020-11-10 NOTE — Telephone Encounter (Signed)
Copied from CRM [id number]. Topic: General - Other >> Nov 10, 2020 12:01 PM Gwenlyn Fudge wrote: Reason for CRM: Pt called stating that she has had to start seeing her cardiologist for her high BP issues. She states that she was supposed to be put on a waitlist and called back, but has heard nothing. Attempted to schedule appt for pt multiple times and she declined. Pt states that she is also needing to speak with someone regarding her covid vaccine. Pt is requesting to have a call back. Please advise.

## 2020-12-10 ENCOUNTER — Telehealth: Payer: Self-pay

## 2020-12-10 ENCOUNTER — Ambulatory Visit: Payer: Commercial Managed Care - HMO | Admitting: Dermatology

## 2020-12-10 ENCOUNTER — Other Ambulatory Visit: Payer: Self-pay

## 2020-12-10 NOTE — Telephone Encounter (Signed)
Copied from CRM [id number]. Topic: Referral - Status >> Dec 10, 2020  2:40 PM Crist Infante wrote: Reason for CRM:  pt states she got to the appt at [facility] and that is when they told her they do not accept her insurance as of last week.  Pt was not very happy. She states they told her she needs to go to [facility] dermatology in [place].  Please submit new referral.

## 2020-12-11 NOTE — Telephone Encounter (Signed)
Insurance was checked and has been sent to [facility] instead.

## 2020-12-19 ENCOUNTER — Telehealth: Payer: Self-pay

## 2020-12-19 ENCOUNTER — Encounter: Payer: Self-pay | Admitting: Family Medicine

## 2020-12-19 ENCOUNTER — Telehealth: Payer: Self-pay | Admitting: Internal Medicine

## 2020-12-19 DIAGNOSIS — L918 Other hypertrophic disorders of the skin: Secondary | ICD-10-CM

## 2020-12-19 NOTE — Telephone Encounter (Signed)
Copied from CRM [id number]. Topic: Quick Communication - See Telephone Encounter >> Dec 19, 2020  9:58 AM Marita Snellen, Nadyne Coombes, [place] wrote: CRM for notification. See Telephone encounter for: 12/19/20. >> Dec 19, 2020 10:00 AM Marita Snellen, Nadyne [place], [place] wrote: Patient called stating that she called [facility] and was told that their referral coordinator is out on maternity leave and that they are not sure when they will give her a call.  She will need a new referral.   Dr. Humberto Leep. [name] is in-network

## 2020-12-19 NOTE — Telephone Encounter (Signed)
Pt has been waiting every since last year for a dermatology appt. Is it possible for you to resubmit the request. [facility] told her that they do not accept her [employer] and at the time that they sent the request they told her that the referral coordinator was on maternity leave and she has yet to hear anything else. Stated that the spot is looking really weird.

## 2020-12-19 NOTE — Telephone Encounter (Signed)
Patient is calling to ask advice regarding growths on her legs - she was advised to call to schedule an appt to see what can be done. Pt wants to know will these be treated? While pt is waiting for her referral to go through to Dermatology, could someone at [facility] treat her Please advise [phone]

## 2020-12-22 ENCOUNTER — Encounter: Payer: Self-pay | Admitting: Family Medicine

## 2020-12-22 NOTE — Telephone Encounter (Signed)
Patient is calling to let the referral coordinator know she does not want to go to Dr. Jesusita Oka- when she call for an appt pt had a bad experience with the scheduler. Was told that Skin tags are not covered under her insurance. Pt told the scheduler that she had some concerns regarding some spots on her skin since her father can melanoma. Pt is requesting another urgent referral placed to another dermatologist.  Cb- [phone]

## 2020-12-22 NOTE — Telephone Encounter (Signed)
Copied from CRM [id number]. Topic: Quick Communication - See Telephone Encounter >> Dec 19, 2020  9:58 AM Marita Snellen, Nadyne Co[place]es, [place] wrote: CRM for notification. See Telephone encounter for: 12/19/20. >> Dec 19, 2020 10:00 AM Marita Snellen, Nadyne Coombes, [place] wrote: Patient called stating that she called [facility] and was told that their referral coordinator is out on maternity leave and that they are not sure when they will give her a call.  She will need a new referral.   Dr. Humberto Leep. [facility] is in-network   Referral has been sent and listed as urgent since it has been so long.

## 2020-12-23 ENCOUNTER — Telehealth: Payer: Self-pay

## 2020-12-23 DIAGNOSIS — R239 Unspecified skin changes: Secondary | ICD-10-CM

## 2020-12-23 DIAGNOSIS — L918 Other hypertrophic disorders of the skin: Secondary | ICD-10-CM

## 2020-12-23 NOTE — Telephone Encounter (Signed)
Sorry for all the confusion, I am confused as well.  New referral entered.  Thanks

## 2020-12-23 NOTE — Telephone Encounter (Signed)
Copied from CRM [id number]. Topic: Referral - Request for Referral >> Dec 23, 2020 11:41 AM Gaetana Michaelis A wrote: Has patient seen PCP for this complaint? No  *If NO, is insurance requiring patient see PCP for this issue before PCP can refer them?  Referral for which specialty: Dermatology   Preferred provider/office: [facility] at [facility] [place]  Reason for referral: Patient would like places on their body checked for melanoma

## 2020-12-23 NOTE — Telephone Encounter (Signed)
I would need a new referral since this referral has been sent to multiple locations.

## 2020-12-23 NOTE — Addendum Note (Signed)
Addended by: Davene Costain on: 12/23/2020 01:58 PM   Modules accepted: Orders

## 2020-12-23 NOTE — Telephone Encounter (Signed)
Conversation: Dermatologist  Doctor[facility])  Me to Marcos Eke, [facility]    12/22/20 3:26 PM Referral has been sent as requested       Jennetta, Flood to Danelle Berry, PA-C    12/22/20 1:42 PM I checked to see which dermatologist are in network and it is saying  most of the ones at [address], [place].   Thanks [employer]

## 2020-12-23 NOTE — Telephone Encounter (Signed)
No problem, referral will be processed and sent as requested.

## 2020-12-23 NOTE — Telephone Encounter (Signed)
Sorry Im so confused?, where has her referral been sent to? The address in [place]? Sorry!

## 2020-12-23 NOTE — Telephone Encounter (Signed)
Yes, after the message from yesterday I went on and sent it to the [place] location that she mentioned.

## 2020-12-24 ENCOUNTER — Telehealth: Payer: Self-pay | Admitting: Family Medicine

## 2020-12-24 NOTE — Telephone Encounter (Signed)
Patient called to ask the doctor to send in another referral for a dermatologist that accepts her insurance.  She stated that her insurance seems to accept the dermatologists listed in [place] on [address].  Please advise and call patient to discuss further at [phone]

## 2020-12-25 NOTE — Telephone Encounter (Signed)
[phone] pt states she is at loss for the nxt step as her ins had given her a list and she has called all the local ones. Pt states that she has some [employer] sites on the list but was not really wanting to go that roull. Pt states a call back from the coordinator would be welcome. Fu at the above # and she will return the call if a message is left as sometimes can not get the call on her job.

## 2020-12-25 NOTE — Telephone Encounter (Signed)
Pt called twice and went straight to vm.  She needs to decide where she wants to go? Per note yesterday she wanted to go to [facility] at [facility] a referral has been placed and I left detailed vm referral was sent and I gave her the # to check status of referral.

## 2020-12-25 NOTE — Telephone Encounter (Signed)
Pt called back states her ins will not cover the [facility] and I told her we also placed on to [facility] and gave her the phone to call there

## 2021-01-01 ENCOUNTER — Encounter: Payer: No Typology Code available for payment source | Admitting: Family Medicine

## 2021-01-09 ENCOUNTER — Encounter: Payer: No Typology Code available for payment source | Admitting: Family Medicine

## 2021-01-16 ENCOUNTER — Encounter: Payer: No Typology Code available for payment source | Admitting: Family Medicine

## 2021-02-04 ENCOUNTER — Encounter: Payer: Self-pay | Admitting: Family Medicine

## 2021-02-10 ENCOUNTER — Encounter: Payer: Self-pay | Admitting: Family Medicine

## 2021-02-10 ENCOUNTER — Ambulatory Visit (INDEPENDENT_AMBULATORY_CARE_PROVIDER_SITE_OTHER): Payer: Commercial Managed Care - HMO | Admitting: Family Medicine

## 2021-02-10 ENCOUNTER — Other Ambulatory Visit: Payer: Self-pay

## 2021-02-10 VITALS — BP 132/80 | HR 72 | Temp 98.4°F | Resp 16 | Ht 64.0 in | Wt 172.5 lb

## 2021-02-10 DIAGNOSIS — Z1231 Encounter for screening mammogram for malignant neoplasm of breast: Secondary | ICD-10-CM

## 2021-02-10 DIAGNOSIS — R739 Hyperglycemia, unspecified: Secondary | ICD-10-CM

## 2021-02-10 DIAGNOSIS — I10 Essential (primary) hypertension: Secondary | ICD-10-CM

## 2021-02-10 DIAGNOSIS — Z0000 Encounter for general adult medical examination without abnormal findings: Secondary | ICD-10-CM | POA: Diagnosis not present

## 2021-02-10 DIAGNOSIS — I471 Supraventricular tachycardia: Secondary | ICD-10-CM | POA: Diagnosis not present

## 2021-02-10 NOTE — Patient Instructions (Signed)
Health Maintenance  Topic Date Due  . COVID-19 Vaccine (3 - Pfizer risk 4-dose series) 06/02/2020  . Flu Shot  04/20/2021  . Mammogram  07/22/2021  . Pap Smear  01/01/2023  . Tetanus Vaccine  12/27/2024  . Colon Cancer Screening  08/16/2027  . Hepatitis C Screening: USPSTF Recommendation to screen - Ages 50-56 yo.  Completed  . HIV Screening  Completed  . HPV Vaccine  Aged Out   If you have not done your shingles shot (shingrix) this can be done in clinic or at a local pharmacy - check with your health insurance for their preferred coverage  Please call [facility] to set up your mammogram when due later this year   Preventive Care 85-56 Years Old, Female Preventive care refers to lifestyle choices and visits with your health care provider that can promote health and wellness. This includes:  A yearly physical exam. This is also called an annual wellness visit.  Regular dental and eye exams.  Immunizations.  Screening for certain conditions.  Healthy lifestyle choices, such as: ? Eating a healthy diet. ? Getting regular exercise. ? Not using drugs or products that contain nicotine and tobacco. ? Limiting alcohol use. What can I expect for my preventive care visit? Physical exam Your health care provider will check your:  Height and weight. These may be used to calculate your BMI (body mass index). BMI is a measurement that tells if you are at a healthy weight.  Heart rate and blood pressure.  Body temperature.  Skin for abnormal spots. Counseling Your health care provider may ask you questions about your:  Past medical problems.  Family's medical history.  Alcohol, tobacco, and drug use.  Emotional well-being.  Home life and relationship well-being.  Sexual activity.  Diet, exercise, and sleep habits.  Work and work Statistician.  Access to firearms.  Method of birth control.  Menstrual cycle.  Pregnancy history. What immunizations do I  need? Vaccines are usually given at various ages, according to a schedule. Your health care provider will recommend vaccines for you based on your age, medical history, and lifestyle or other factors, such as travel or where you work.   What tests do I need? Blood tests  Lipid and cholesterol levels. These may be checked every 5 years, or more often if you are over 41 years old.  Hepatitis C test.  Hepatitis B test. Screening  Lung cancer screening. You may have this screening every year starting at age 17 if you have a 30-pack-year history of smoking and currently smoke or have quit within the past 15 years.  Colorectal cancer screening. ? All adults should have this screening starting at age 33 and continuing until age 45. ? Your health care provider may recommend screening at age 59 if you are at increased risk. ? You will have tests every 1-10 years, depending on your results and the type of screening test.  Diabetes screening. ? This is done by checking your blood sugar (glucose) after you have not eaten for a while (fasting). ? You may have this done every 1-3 years.  Mammogram. ? This may be done every 1-2 years. ? Talk with your health care provider about when you should start having regular mammograms. This may depend on whether you have a family history of breast cancer.  BRCA-related cancer screening. This may be done if you have a family history of breast, ovarian, tubal, or peritoneal cancers.  Pelvic exam and Pap test. ? This  may be done every 3 years starting at age 71. ? Starting at age 15, this may be done every 5 years if you have a Pap test in combination with an HPV test. Other tests  STD (sexually transmitted disease) testing, if you are at risk.  Bone density scan. This is done to screen for osteoporosis. You may have this scan if you are at high risk for osteoporosis. Talk with your health care provider about your test results, treatment options, and if  necessary, the need for more tests. Follow these instructions at home: Eating and drinking  Eat a diet that includes fresh fruits and vegetables, whole grains, lean protein, and low-fat dairy products.  Take vitamin and mineral supplements as recommended by your health care provider.  Do not drink alcohol if: ? Your health care provider tells you not to drink. ? You are pregnant, may be pregnant, or are planning to become pregnant.  If you drink alcohol: ? Limit how much you have to 0-1 drink a day. ? Be aware of how much alcohol is in your drink. In the [place] one drink equals one 12 oz bottle of beer (355 mL), one 5 oz glass of wine (148 mL), or one 1 oz glass of hard liquor (44 mL).   Lifestyle  Take daily care of your teeth and gums. Brush your teeth every morning and night with fluoride toothpaste. Floss one time each day.  Stay active. Exercise for at least 30 minutes 5 or more days each week.  Do not use any products that contain nicotine or tobacco, such as cigarettes, e-cigarettes, and chewing tobacco. If you need help quitting, ask your health care provider.  Do not use drugs.  If you are sexually active, practice safe sex. Use a condom or other form of protection to prevent STIs (sexually transmitted infections).  If you do not wish to become pregnant, use a form of birth control. If you plan to become pregnant, see your health care provider for a prepregnancy visit.  If told by your health care provider, take low-dose aspirin daily starting at age 19.  Find healthy ways to cope with stress, such as: ? Meditation, yoga, or listening to music. ? Journaling. ? Talking to a trusted person. ? Spending time with friends and family. Safety  Always wear your seat belt while driving or riding in a vehicle.  Do not drive: ? If you have been drinking alcohol. Do not ride with someone who has been drinking. ? When you are tired or distracted. ? While texting.  Wear a  helmet and other protective equipment during sports activities.  If you have firearms in your house, make sure you follow all gun safety procedures. What's next?  Visit your health care provider once a year for an annual wellness visit.  Ask your health care provider how often you should have your eyes and teeth checked.  Stay up to date on all vaccines. This information is not intended to replace advice given to you by your health care provider. Make sure you discuss any questions you have with your health care provider. Document Revised: 06/10/2020 Document Reviewed: 05/18/2018 Elsevier Patient Education  2021 [employer].

## 2021-02-10 NOTE — Progress Notes (Signed)
Patient: Carrie Nichols, Female    DOB: 09-15-1966, 55 y.o.   MRN: [id number] Towanda Malkin, MD Visit Date: 02/10/2021  Today's Provider: Delsa Grana, PA-C   Chief Complaint  Patient presents with   Annual Exam   Subjective:   Annual physical exam:  Carrie Nichols is a 55 y.o. female who presents today for complete physical exam:  No concerns with sleep, trying to stay active and eat fairly balanced diet  USPSTF grade A and B recommendations - reviewed and addressed today  Depression:  Phq 9 completed today by patient, was reviewed by me with patient in the room PHQ score is neg, pt feels well PHQ 2/9 Scores 02/10/2021 09/25/2020 08/28/2020 01/08/2020  PHQ - 2 Score 0 0 0 0  PHQ- 9 Score 0 - - 0   Depression screen [facility] 2/9 02/10/2021 09/25/2020 08/28/2020 01/08/2020 01/01/2020  Decreased Interest 0 0 0 0 0  Down, Depressed, Hopeless 0 0 0 0 0  PHQ - 2 Score 0 0 0 0 0  Altered sleeping 0 - - 0 0  Tired, decreased energy 0 - - 0 0  Change in appetite 0 - - 0 0  Feeling bad or failure about yourself  0 - - 0 0  Trouble concentrating 0 - - 0 0  Moving slowly or fidgety/restless 0 - - 0 0  Suicidal thoughts 0 - - 0 0  PHQ-9 Score 0 - - 0 0  Difficult doing work/chores Not difficult at all - - Not difficult at all Not difficult at all    Alcohol screening: [place] Office Visit from 01/08/2020 in [facility]  AUDIT-C Score 0       Immunizations and Health Maintenance: Health Maintenance  Topic Date Due   COVID-19 Vaccine (3 - Pfizer risk 4-dose series) 06/02/2020   INFLUENZA VACCINE  04/20/2021   MAMMOGRAM  07/22/2021   PAP SMEAR-Modifier  01/01/2023   TETANUS/TDAP  12/27/2024   COLONOSCOPY (Pts 45-38yr Insurance coverage will need to be confirmed)  08/16/2027   Hepatitis C Screening  Completed   HIV Screening  Completed   HPV VACCINES  Aged Out     Hep C Screening: done in the past  STD testing and prevention  (HIV/chl/gon/syphilis):  see above, no additional testing desired by pt today  Intimate partner violence:  safe  Sexual History/Pain during Intercourse: Single  Menstrual History/LMP/Abnormal Bleeding:  No LMP recorded. Patient is perimenopausal.  LMP 5-6 months -   Incontinence Symptoms: none  Breast cancer: cyst in left breast - sore a few weeks ago Last Mammogram: *see HM list above BRCA gene screening: none known  Cervical cancer screening: UTD, due in 2024 Pt  family hx of cancers - breast, ovarian, uterine, colon:     Osteoporosis:   Discussion on osteoporosis per age, including high calcium and vitamin D supplementation, weight bearing exercises Pt is not supplementing with daily calcium/Vit D.   Skin cancer:  Hx of skin CA -  NO Discussed atypical lesions   Colorectal cancer:   Colonoscopy is UTD  Thinks she is supposed to have repeated in 5 year, on HM tab at 10 year repeat -  Discussed concerning signs and sx of CRC, pt denies melena, hematochezia, bowel changes  Lung cancer:   Low Dose CT Chest recommended if Age 56-80years, 30 pack-year currently smoking OR have quit w/in 15years. Patient does not qualify.    Social History   Tobacco  Use   Smoking status: Never Smoker   Smokeless tobacco: Never Used  Vaping Use   Vaping Use: Never used  Substance Use Topics   Alcohol use: No   Drug use: No     Flowsheet Row [facility] from [facility]/08/[facility]  AUDIT-C Score 0       Family History  Problem Relation Age of Onset   Hyperlipidemia Mother    High blood pressure Mother    Skin cancer Father    Colon polyps Father    Hyperlipidemia Brother    Ovarian cancer Paternal Grandmother    Prostate cancer Paternal Grandfather    Colon cancer Paternal Uncle    Breast cancer Neg Hx    lymphoma in multiple family members  Blood pressure/Hypertension: BP Readings from Last 3 Encounters:  02/10/21 132/80  08/28/20 (!) 146/88   04/12/20 (!) 158/84    Weight/Obesity: Wt Readings from Last 3 Encounters:  02/10/21 172 lb 8 oz (78.2 kg)  09/25/20 174 lb 12.8 oz (79.3 kg)  08/28/20 171 lb 14.4 oz (78 kg)   BMI Readings from Last 3 Encounters:  02/10/21 29.61 kg/m  09/25/20 30.00 kg/m  08/28/20 29.51 kg/m     Lipids:  Lab Results  Component Value Date   CHOL 166 12/05/2019   Lab Results  Component Value Date   HDL 46 (L) 12/05/2019   Lab Results  Component Value Date   LDLCALC 98 12/05/2019   Lab Results  Component Value Date   TRIG 119 12/05/2019   Lab Results  Component Value Date   CHOLHDL 3.6 12/05/2019   No results found for: LDLDIRECT Based on the results of lipid panel his/her cardiovascular risk factor ( using [facility] )  in the next 10 years is: The 10-year ASCVD risk score Mikey Bussing DC Brooke Bonito., et al., 2013) is: 2.8%   Values used to calculate the score:     Age: 53 years     Sex: Female     Is Non-Hispanic African American: No     Diabetic: No     Tobacco smoker: No     Systolic Blood Pressure: 161 mmHg     Is BP treated: Yes     HDL Cholesterol: 46 mg/dL     Total Cholesterol: 166 mg/dL Glucose:  Glucose  Date Value Ref Range Status  03/12/2012 87 65 - 99 mg/dL Final   Glucose, Bld  Date Value Ref Range Status  08/28/2020 90 65 - 99 mg/dL Final    Comment:    .            Fasting reference interval .   12/05/2019 116 (H) 65 - 99 mg/dL Final    Comment:    .            Fasting reference interval . For someone without known diabetes, a glucose value between 100 and 125 mg/dL is consistent with prediabetes and should be confirmed with a follow-up test. .    Hypertension: BP Readings from Last 3 Encounters:  02/10/21 132/80  08/28/20 (!) 146/88  04/12/20 (!) 158/84   Obesity: Wt Readings from Last 3 Encounters:  02/10/21 172 lb 8 oz (78.2 kg)  09/25/20 174 lb 12.8 oz (79.3 kg)  08/28/20 171 lb 14.4 oz (78 kg)   BMI Readings from Last 3 Encounters:   02/10/21 29.61 kg/m  09/25/20 30.00 kg/m  08/28/20 29.51 kg/m      Advanced Care Planning:  A voluntary  discussion about advance care planning including the explanation and discussion of advance directives.     Social History      She        Social History   Socioeconomic History   Marital status: Single    Spouse name: Not on file   Number of children: Not on file   Years of education: Not on file   Highest education level: Not on file  Occupational History   Not on file  Tobacco Use   Smoking status: Never Smoker   Smokeless tobacco: Never Used  Vaping Use   Vaping Use: Never used  Substance and Sexual Activity   Alcohol use: No   Drug use: No   Sexual activity: Not on file  Other Topics Concern   Not on file  Social History Narrative   Not on file   Social Determinants of Health   Financial Resource Strain: Not on file  Food Insecurity: Not on file  Transportation Needs: Not on file  Physical Activity: Not on file  Stress: Not on file  Social Connections: Not on file    Family History        Family History  Problem Relation Age of Onset   Hyperlipidemia Mother    High blood pressure Mother    Skin cancer Father    Colon polyps Father    Hyperlipidemia Brother    Ovarian cancer Paternal Grandmother    Prostate cancer Paternal Grandfather    Colon cancer Paternal Uncle    Breast cancer Neg Hx     Patient Active Problem List   Diagnosis Date Noted   Primary hypertension 09/25/2020   Elevated BP without diagnosis of hypertension 08/28/2020   Other insomnia 12/05/2019   Arthritis 12/05/2019   Easy bruising 12/05/2019   Mitral valve prolapse 05/10/2016   SVT (supraventricular tachycardia) ([place]) 05/10/2016    Past Surgical History:  Procedure Laterality Date   COLONOSCOPY WITH PROPOFOL N/A 08/15/2017   Procedure: COLONOSCOPY WITH PROPOFOL;  Surgeon: Lollie Sails, MD;  Location: [facility] ENDOSCOPY;  Service: Endoscopy;  Laterality: N/A;      Current Outpatient Medications:    acetaminophen (TYLENOL) 500 MG tablet, Take 500 mg by mouth every 6 (six) hours as needed., Disp: , Rfl:    amLODipine (NORVASC) 5 MG tablet, Take 5 mg by mouth daily., Disp: , Rfl:    metoprolol tartrate (LOPRESSOR) 50 MG tablet, Take 50 mg by mouth 2 (two) times daily., Disp: , Rfl:   Allergies  Allergen Reactions   Codeine Rash    Patient Care Team: Towanda Malkin, MD as PCP - General (Internal Medicine)  Review of Systems  Constitutional: Negative.  Negative for activity change, appetite change, fatigue and unexpected weight change.  HENT: Negative.    Eyes: Negative.   Respiratory: Negative.  Negative for shortness of breath.   Cardiovascular: Negative.  Negative for chest pain, palpitations and leg swelling.  Gastrointestinal: Negative.  Negative for abdominal pain and blood in stool.  Endocrine: Negative.   Genitourinary: Negative.   Musculoskeletal: Negative.  Negative for arthralgias, gait problem, joint swelling and myalgias.  Skin: Negative.  Negative for pallor and rash.  Allergic/Immunologic: Negative.   Neurological: Negative.  Negative for syncope and weakness.  Hematological: Negative.   Psychiatric/Behavioral: Negative.  Negative for dysphoric mood, self-injury and suicidal ideas. The patient is not nervous/anxious.   All other systems reviewed and are negative.   I personally reviewed active problem list, medication list, allergies, family  history, social history, health maintenance, notes from last encounter, lab results, imaging with the patient/caregiver today.        Objective:   Vitals:  Vitals:   02/10/21 1453  BP: 132/80  Pulse: 72  Resp: 16  Temp: 98.4 F (36.9 C)  SpO2: 98%  Weight: 172 lb 8 oz (78.2 kg)  Height: '5\' 4"'  (1.626 m)    Body mass index is 29.61 kg/m.  Physical Exam Vitals and nursing note reviewed.  Constitutional:      General: She is not in acute distress.     Appearance: Normal appearance. She is well-developed. She is not ill-appearing, toxic-appearing or diaphoretic.     Interventions: Face mask in place.  HENT:     Head: Normocephalic and atraumatic.     Right Ear: Tympanic membrane, ear canal and external ear normal.     Left Ear: Tympanic membrane, ear canal and external ear normal.     Nose: Nose normal. No congestion.     Mouth/Throat:     Mouth: Mucous membranes are moist.     Pharynx: Oropharynx is clear. No oropharyngeal exudate or posterior oropharyngeal erythema.  Eyes:     General: Lids are normal. No scleral icterus.       Right eye: No discharge.        Left eye: No discharge.     Conjunctiva/sclera: Conjunctivae normal.     Pupils: Pupils are equal, round, and reactive to light.  Neck:     Thyroid: No thyroid mass, thyromegaly or thyroid tenderness.     Trachea: Trachea and phonation normal. No tracheal deviation.  Cardiovascular:     Rate and Rhythm: Normal rate and regular rhythm.     Pulses: Normal pulses.          Radial pulses are 2+ on the right side and 2+ on the left side.       Posterior tibial pulses are 2+ on the right side and 2+ on the left side.     Heart sounds: Normal heart sounds. No murmur heard.   No friction rub. No gallop.  Pulmonary:     Effort: Pulmonary effort is normal. No respiratory distress.     Breath sounds: Normal breath sounds. No stridor. No wheezing, rhonchi or rales.  Chest:     Chest wall: No tenderness.  Abdominal:     General: Bowel sounds are normal. There is no distension.     Palpations: Abdomen is soft.  Musculoskeletal:     Cervical back: Normal range of motion. No rigidity.     Right lower leg: No edema.     Left lower leg: No edema.  Lymphadenopathy:     Cervical: No cervical adenopathy.  Skin:    General: Skin is warm and dry.     Coloration: Skin is not jaundiced or pale.     Findings: No rash.  Neurological:     Mental Status: She is alert.     Motor: No abnormal  muscle tone.     Gait: Gait normal.  Psychiatric:        Mood and Affect: Mood normal.        Speech: Speech normal.        Behavior: Behavior normal.      Fall Risk: Fall Risk  02/10/2021 09/25/2020 08/28/2020 01/08/2020 01/01/2020  Falls in the past year? 0 0 1 0 1  Number falls in past yr: 0 0 1 0 1  Injury with Fall? 0  0 0 0 0  Comment - - - - -    Functional Status Survey: Is the patient deaf or have difficulty hearing?: No Does the patient have difficulty seeing, even when wearing glasses/contacts?: Yes Does the patient have difficulty concentrating, remembering, or making decisions?: No Does the patient have difficulty walking or climbing stairs?: No Does the patient have difficulty dressing or bathing?: No Does the patient have difficulty doing errands alone such as visiting a doctor's office or shopping?: No   Assessment & Plan:    CPE completed today  USPSTF grade A and B recommendations reviewed with patient; age-appropriate recommendations, preventive care, screening tests, etc discussed and encouraged; healthy living encouraged; see AVS for patient education given to patient  Discussed importance of 150 minutes of physical activity weekly, AHA exercise recommendations given to pt in AVS/handout  Discussed importance of healthy diet:  eating lean meats and proteins, avoiding trans fats and saturated fats, avoid simple sugars and excessive carbs in diet, eat 6 servings of fruit/vegetables daily and drink plenty of water and avoid sweet beverages.    Recommended pt to do annual eye exam and routine dental exams/cleanings  Depression, alcohol, fall screening completed as documented above and per flowsheets  Reviewed Health Maintenance: Health Maintenance  Topic Date Due   COVID-19 Vaccine (3 - Pfizer risk 4-dose series) 06/02/2020   INFLUENZA VACCINE  04/20/2021   MAMMOGRAM  07/22/2021   PAP SMEAR-Modifier  01/01/2023   TETANUS/TDAP  12/27/2024   COLONOSCOPY (Pts  45-27yr Insurance coverage will need to be confirmed)  08/16/2027   Hepatitis C Screening  Completed   HIV Screening  Completed   HPV VACCINES  Aged Out    Immunizations: Immunization History  Administered Date(s) Administered   PFIZER(Purple Top)SARS-COV-2 Vaccination 04/12/2020, 05/05/2020   Tdap 12/28/2014      ICD-10-CM   1. Adult general medical exam  ZK38.38COMPLETE METABOLIC PANEL WITH GFR    CBC w/Diff/Platelet    Lipid panel    TSH    Hemoglobin A1c    2. Primary hypertension  IF84COMPLETE METABOLIC PANEL WITH GFR   well controlled today on norvasc 5 mg and metoprolol 50 mg BID    3. SVT (supraventricular tachycardia) (HCC)  I47.1    on BB, sx well controlled, HR in normal range, no SE or concerns    4. Hyperglycemia  R73.9 Hemoglobin A1c    5. Encounter for screening mammogram for malignant neoplasm of breast  Z12.31 MM 3D SCREEN BREAST BILATERAL     Nodules/lesions legs ankles - pink to flesh colored papules nodules Waiting to get back in with [facility]      LDelsa Grana PA-C 02/10/21 3:28 PM  [facility]

## 2021-02-11 ENCOUNTER — Encounter: Payer: Self-pay | Admitting: Family Medicine

## 2021-02-11 LAB — TSH: TSH: 1.75 mIU/L

## 2021-02-11 LAB — COMPLETE METABOLIC PANEL WITH GFR
AG Ratio: 1.7 (calc) (ref 1.0–2.5)
ALT: 13 U/L (ref 6–29)
AST: 12 U/L (ref 10–35)
Albumin: 4.2 g/dL (ref 3.6–5.1)
Alkaline phosphatase (APISO): 56 U/L (ref 37–153)
BUN: 17 mg/dL (ref 7–25)
CO2: 28 mmol/L (ref 20–32)
Calcium: 9.2 mg/dL (ref 8.6–10.4)
Chloride: 106 mmol/L (ref 98–110)
Creat: 0.86 mg/dL (ref 0.50–1.05)
GFR, Est African American: 88 mL/min/{1.73_m2} (ref >=60)
GFR, Est Non African American: 76 mL/min/{1.73_m2} (ref >=60)
Globulin: 2.5 g/dL (calc) (ref 1.9–3.7)
Glucose, Bld: 103 mg/dL — ABNORMAL HIGH (ref 65–99)
Potassium: 4.1 mmol/L (ref 3.5–5.3)
Sodium: 139 mmol/L (ref 135–146)
Total Bilirubin: 0.3 mg/dL (ref 0.2–1.2)
Total Protein: 6.7 g/dL (ref 6.1–8.1)

## 2021-02-11 LAB — LIPID PANEL
Cholesterol: 177 mg/dL (ref <200)
HDL: 45 mg/dL — ABNORMAL LOW (ref >=50)
LDL Cholesterol (Calc): 104 mg/dL (calc) — ABNORMAL HIGH
Non-HDL Cholesterol (Calc): 132 mg/dL (calc) — ABNORMAL HIGH (ref <130)
Total CHOL/HDL Ratio: 3.9 (calc) (ref <5.0)
Triglycerides: 168 mg/dL — ABNORMAL HIGH (ref <150)

## 2021-02-11 LAB — CBC WITH DIFFERENTIAL/PLATELET
Absolute Monocytes: 549 cells/uL (ref 200–950)
Basophils Absolute: 60 cells/uL (ref 0–200)
Basophils Relative: 0.9 %
Eosinophils Absolute: 482 cells/uL (ref 15–500)
Eosinophils Relative: 7.2 %
HCT: 36.2 % (ref 35.0–45.0)
Hemoglobin: 12 g/dL (ref 11.7–15.5)
Lymphs Abs: 2057 cells/uL (ref 850–3900)
MCH: 28.7 pg (ref 27.0–33.0)
MCHC: 33.1 g/dL (ref 32.0–36.0)
MCV: 86.6 fL (ref 80.0–100.0)
MPV: 11.2 fL (ref 7.5–12.5)
Monocytes Relative: 8.2 %
Neutro Abs: 3551 cells/uL (ref 1500–7800)
Neutrophils Relative %: 53 %
Platelets: 308 10*3/uL (ref 140–400)
RBC: 4.18 10*6/uL (ref 3.80–5.10)
RDW: 13 % (ref 11.0–15.0)
Total Lymphocyte: 30.7 %
WBC: 6.7 10*3/uL (ref 3.8–10.8)

## 2021-02-11 LAB — HEMOGLOBIN A1C
Hgb A1c MFr Bld: 5.6 % of total Hgb (ref <5.7)
Mean Plasma Glucose: 114 mg/dL
eAG (mmol/L): 6.3 mmol/L

## 2021-02-17 ENCOUNTER — Encounter: Payer: Self-pay | Admitting: Family Medicine

## 2021-02-20 ENCOUNTER — Encounter: Payer: Self-pay | Admitting: Family Medicine

## 2021-03-11 ENCOUNTER — Encounter: Payer: Self-pay | Admitting: Family Medicine

## 2021-03-17 ENCOUNTER — Encounter: Payer: Self-pay | Admitting: Family Medicine

## 2021-03-25 NOTE — Telephone Encounter (Signed)
If the HM tab is correct and matches the GI paperwork then f/up according to HM tab - put in referral if she is due and new referral is needed thanks

## 2021-04-01 ENCOUNTER — Encounter: Payer: Self-pay | Admitting: Family Medicine

## 2021-04-01 DIAGNOSIS — R239 Unspecified skin changes: Secondary | ICD-10-CM

## 2021-06-04 ENCOUNTER — Encounter: Payer: Self-pay | Admitting: Family Medicine

## 2021-06-08 ENCOUNTER — Encounter: Payer: Self-pay | Admitting: Family Medicine

## 2021-06-17 ENCOUNTER — Encounter: Payer: Self-pay | Admitting: Family Medicine

## 2021-06-18 ENCOUNTER — Other Ambulatory Visit: Payer: Self-pay

## 2021-06-18 DIAGNOSIS — N644 Mastodynia: Secondary | ICD-10-CM

## 2021-06-18 DIAGNOSIS — Z1231 Encounter for screening mammogram for malignant neoplasm of breast: Secondary | ICD-10-CM

## 2021-07-27 ENCOUNTER — Ambulatory Visit
Admission: RE | Admit: 2021-07-27 | Discharge: 2021-07-27 | Disposition: A | Discharge status: Home / Self Care | Payer: Self-pay | Source: Ambulatory Visit | Attending: Family Medicine | Admitting: Family Medicine

## 2021-07-27 ENCOUNTER — Other Ambulatory Visit: Payer: Self-pay | Admitting: Family Medicine

## 2021-07-27 ENCOUNTER — Other Ambulatory Visit: Payer: Self-pay

## 2021-07-27 DIAGNOSIS — Z1231 Encounter for screening mammogram for malignant neoplasm of breast: Secondary | ICD-10-CM

## 2021-07-27 DIAGNOSIS — N644 Mastodynia: Secondary | ICD-10-CM

## 2021-07-27 DIAGNOSIS — N6311 Unspecified lump in the right breast, upper outer quadrant: Secondary | ICD-10-CM

## 2021-09-07 ENCOUNTER — Emergency Department
Admission: EM | Admit: 2021-09-07 | Discharge: 2021-09-07 | Disposition: A | Discharge status: Home / Self Care | Payer: Self-pay | Attending: Emergency Medicine | Admitting: Emergency Medicine

## 2021-09-07 ENCOUNTER — Other Ambulatory Visit: Payer: Self-pay

## 2021-09-07 ENCOUNTER — Encounter: Payer: Self-pay | Admitting: Emergency Medicine

## 2021-09-07 ENCOUNTER — Emergency Department: Payer: Self-pay

## 2021-09-07 DIAGNOSIS — R002 Palpitations: Secondary | ICD-10-CM | POA: Insufficient documentation

## 2021-09-07 DIAGNOSIS — U071 COVID-19: Secondary | ICD-10-CM | POA: Insufficient documentation

## 2021-09-07 DIAGNOSIS — R55 Syncope and collapse: Secondary | ICD-10-CM | POA: Insufficient documentation

## 2021-09-07 DIAGNOSIS — Z79899 Other long term (current) drug therapy: Secondary | ICD-10-CM | POA: Insufficient documentation

## 2021-09-07 DIAGNOSIS — I10 Essential (primary) hypertension: Secondary | ICD-10-CM | POA: Insufficient documentation

## 2021-09-07 LAB — CBC
HCT: 41 % (ref 36.0–46.0)
Hemoglobin: 13.7 g/dL (ref 12.0–15.0)
MCH: 28.7 pg (ref 26.0–34.0)
MCHC: 33.4 g/dL (ref 30.0–36.0)
MCV: 85.8 fL (ref 80.0–100.0)
Platelets: 286 10*3/uL (ref 150–400)
RBC: 4.78 MIL/uL (ref 3.87–5.11)
RDW: 12.7 % (ref 11.5–15.5)
WBC: 3.4 10*3/uL — ABNORMAL LOW (ref 4.0–10.5)
nRBC: 0 % (ref 0.0–0.2)

## 2021-09-07 LAB — BASIC METABOLIC PANEL
Anion gap: 5 (ref 5–15)
BUN: 12 mg/dL (ref 6–20)
CO2: 27 mmol/L (ref 22–32)
Calcium: 8.7 mg/dL — ABNORMAL LOW (ref 8.9–10.3)
Chloride: 107 mmol/L (ref 98–111)
Creatinine, Ser: 0.81 mg/dL (ref 0.44–1.00)
GFR, Estimated: >60 mL/min (ref >60)
Glucose, Bld: 114 mg/dL — ABNORMAL HIGH (ref 70–99)
Potassium: 4 mmol/L (ref 3.5–5.1)
Sodium: 139 mmol/L (ref 135–145)

## 2021-09-07 LAB — TROPONIN I (HIGH SENSITIVITY): Troponin I (High Sensitivity): 4 ng/L (ref <18)

## 2021-09-07 MED ORDER — SODIUM CHLORIDE 0.9 % IV BOLUS
1000.0000 mL | Freq: Once | INTRAVENOUS | Status: AC
  Administered 2021-09-07: 08:00:00 1000 mL via INTRAVENOUS

## 2021-09-07 MED ORDER — ONDANSETRON HCL 4 MG/2ML IJ SOLN
4.0000 mg | Freq: Once | INTRAMUSCULAR | Status: AC
  Administered 2021-09-07: 08:00:00 4 mg via INTRAVENOUS
  Filled 2021-09-07: qty 2

## 2021-09-07 MED ORDER — KETOROLAC TROMETHAMINE 30 MG/ML IJ SOLN
15.0000 mg | Freq: Once | INTRAMUSCULAR | Status: AC
  Administered 2021-09-07: 08:00:00 15 mg via INTRAVENOUS
  Filled 2021-09-07: qty 1

## 2021-09-07 NOTE — ED Notes (Signed)
EDP at bedside, pt will discharge after fluid bolus.  Pt to ED for weakness, dyspnea with exertion. Pt had positive covid test 2 days ago and has felt fatigued and weak, especially with walking.  Pt has hx SVT and had palpitations this morning. Had only slept for 1 hour. SPO2 100% on RA, lungs clear.

## 2021-09-07 NOTE — ED Triage Notes (Signed)
Pt to ED via POV, states took a rapid home test on Saturday and tested + for covid, pt states started feeling weak since Saturday. Pt states awoke this morning with c/o fatigue and generalized weakness and palpitations. Pt also c/o near syncopal episode. Pt states hx of SVT.

## 2021-09-07 NOTE — ED Provider Notes (Signed)
[facility] Emergency Department Provider Note  Time seen: 8:13 AM  I have reviewed the triage vital signs and the nursing notes.   HISTORY  Chief Complaint Covid Positive and Palpitations   HPI Carrie Nichols is a 55 y.o. female with a past medical history of SVT, presents to the emergency department for palpitations and lightheadedness.  According to the patient she began feeling fatigued and congested Friday evening, Saturday morning she tested positive for COVID with a home rapid test.  Patient states she has continued to feel fatigued over the weekend.  Today while getting ready this morning she began feeling her heart race and she felt lightheaded.  She is not sure if this was her SVT or if this was due to COVID.  Patient came to the emergency department for evaluation.  Upon arrival to the emergency department states she is feeling better, heart is no longer racing but she continues to feel fatigued.  Patient has been experiencing cough since last night as well as congestion over the past few days with fatigue and intermittent episodes of diarrhea.   Past Medical History:  Diagnosis Date   Dysrhythmia    SVT   Mitral valve prolapse    MRSA infection    Tachycardia     Patient Active Problem List   Diagnosis Date Noted   Primary hypertension 09/25/2020   Elevated BP without diagnosis of hypertension 08/28/2020   Other insomnia 12/05/2019   Arthritis 12/05/2019   Easy bruising 12/05/2019   Mitral valve prolapse 05/10/2016   SVT (supraventricular tachycardia) (H[facility]) 05/10/2016    Past Surgical History:  Procedure Laterality Date   COLONOSCOPY WITH PROPOFOL N/A 08/15/2017   Procedure: COLONOSCOPY WITH PROPOFOL;  Surgeon: Christena Deem, MD;  Location: [facility] ENDOSCOPY;  Service: Endoscopy;  Laterality: N/A;    Prior to Admission medications   Medication Sig Start Date End Date Taking? Authorizing Provider  acetaminophen (TYLENOL) 500 MG  tablet Take 500 mg by mouth every 6 (six) hours as needed.    [provider]  amLODipine (NORVASC) 5 MG tablet Take 5 mg by mouth daily.    [provider]  metoprolol tartrate (LOPRESSOR) 50 MG tablet Take 50 mg by mouth 2 (two) times daily.    [provider]    Allergies  Allergen Reactions   Covid-19 ([place]) Vaccine Other (See Comments)    Spike in BP, numbness tingling in mouth, difficulty seeing   Codeine Rash    Family History  Problem Relation Age of Onset   Hyperlipidemia Mother    High blood pressure Mother    Skin cancer Father    Colon polyps Father    Hyperlipidemia Brother    Ovarian cancer Paternal Grandmother    Prostate cancer Paternal Grandfather    Colon cancer Paternal Uncle    Breast cancer Neg Hx     Social History Social History   Tobacco Use   Smoking status: Never   Smokeless tobacco: Never  Vaping Use   Vaping Use: Never used  Substance Use Topics   Alcohol use: No   Drug use: No    Review of Systems Constitutional: Negative for fever.  Positive for generalized fatigue. ENT: Positive for congestion x3 days Cardiovascular: Negative for chest pain. Respiratory: Negative for shortness of breath.  Positive for cough since yesterday Gastrointestinal: Negative for abdominal pain, vomiting.  Intermittent diarrhea x3 days. Genitourinary: Negative for urinary compaints Musculoskeletal: Negative for musculoskeletal complaints Neurological: Negative for headache  All other ROS negative  ____________________________________________   PHYSICAL EXAM:  VITAL SIGNS: ED Triage Vitals  Enc Vitals Group     BP 09/07/21 0730 (!) 151/91     Pulse Rate 09/07/21 0730 90     Resp 09/07/21 0730 20     Temp 09/07/21 0730 98.9 F (37.2 C)     Temp Source 09/07/21 0730 Oral     SpO2 09/07/21 0730 97 %     Weight 09/07/21 0726 162 lb (73.5 kg)     Height 09/07/21 0726 5\' 4"  (1.626 m)     Head Circumference --      Peak Flow --       Pain Score --      Pain Loc --      Pain Edu? --      Excl. in GC? --    Constitutional: Alert and oriented. Well appearing and in no distress. Eyes: Normal exam ENT      Head: Normocephalic and atraumatic.      Mouth/Throat: Mucous membranes are moist. Cardiovascular: Normal rate, regular rhythm. Respiratory: Normal respiratory effort without tachypnea nor retractions. Breath sounds are clear  Gastrointestinal: Soft and nontender. No distention.   Musculoskeletal: Nontender with normal range of motion in all extremities.  Neurologic:  Normal speech and language. No gross focal neurologic deficits  Skin:  Skin is warm, dry and intact.  Psychiatric: Mood and affect are normal.   ____________________________________________    EKG  EKG viewed and interpreted by myself shows a normal sinus rhythm 88 bpm with a narrow QRS, normal axis, normal intervals, no concerning ST changes.  ____________________________________________    INITIAL IMPRESSION / ASSESSMENT AND PLAN / ED COURSE  Pertinent labs & imaging results that were available during my care of the patient were reviewed by me and considered in my medical decision making (see chart for details).   Patient presents to the emergency department for near syncope palpitations generalized fatigue weakness.  Tested positive for COVID 2 days ago.  Symptoms are suggestive of COVID, dehydration.  Patient denies any chest pain at any point.  No longer is feeling palpitations or lightheadedness.  We will check labs, IV hydrate, treat with Toradol and Zofran.  I discussed the possibility of starting paxlovid.  Patient states she would prefer to avoid that medication.  We will treat the patient symptomatically.  As long as the labs are normal anticipate discharge home with supportive care.  Labs are largely within normal limits.  Patient feeling better with medications.  We will discharge home with continued supportive care.  Carrie Nichols was evaluated in Emergency Department on 09/07/2021 for the symptoms described in the history of present illness. She was evaluated in the context of the global COVID-19 pandemic, which necessitated consideration that the patient might be at risk for infection with the SARS-CoV-2 virus that causes COVID-19. Institutional protocols and algorithms that pertain to the evaluation of patients at risk for COVID-19 are in a state of rapid change based on information released by regulatory bodies including the CDC and federal and state organizations. These policies and algorithms were followed during the patient's care in the ED.  ____________________________________________   FINAL CLINICAL IMPRESSION(S) / ED DIAGNOSES  COVID-19 Near syncope Palpitations   09/09/2021, MD 09/07/21 [phone]

## 2021-09-07 NOTE — ED Notes (Signed)
Says she has not been eating and drinking as usual.  Nasal congestion.  Says she got up this am and heart went crazy.  Says she has hx svt, so they told her to come in.  Currently nsr.

## 2022-02-11 ENCOUNTER — Encounter: Payer: Commercial Managed Care - HMO | Admitting: Family Medicine

## 2022-06-24 ENCOUNTER — Other Ambulatory Visit: Payer: Self-pay | Admitting: Family Medicine

## 2022-06-24 DIAGNOSIS — Z1231 Encounter for screening mammogram for malignant neoplasm of breast: Secondary | ICD-10-CM

## 2022-07-28 ENCOUNTER — Ambulatory Visit
Admission: RE | Admit: 2022-07-28 | Discharge: 2022-07-28 | Disposition: A | Discharge status: Home / Self Care | Payer: PRIVATE HEALTH INSURANCE | Source: Ambulatory Visit | Attending: Family Medicine | Admitting: Family Medicine

## 2022-07-28 DIAGNOSIS — Z1231 Encounter for screening mammogram for malignant neoplasm of breast: Secondary | ICD-10-CM | POA: Insufficient documentation

## 2022-12-17 ENCOUNTER — Telehealth: Payer: Self-pay | Admitting: Physician Assistant

## 2022-12-17 DIAGNOSIS — R3989 Other symptoms and signs involving the genitourinary system: Secondary | ICD-10-CM

## 2022-12-17 MED ORDER — CEPHALEXIN 500 MG PO CAPS: 500.0000 mg | ORAL_CAPSULE | Freq: Two times a day (BID) | ORAL | 0 refills | Status: DC

## 2022-12-17 NOTE — Progress Notes (Signed)

## 2023-02-02 ENCOUNTER — Ambulatory Visit (INDEPENDENT_AMBULATORY_CARE_PROVIDER_SITE_OTHER): Payer: 59 | Admitting: Urology

## 2023-02-02 ENCOUNTER — Encounter: Payer: Self-pay | Admitting: Urology

## 2023-02-02 VITALS — BP 133/81 | HR 73 | Ht 64.0 in | Wt 160.0 lb

## 2023-02-02 DIAGNOSIS — R319 Hematuria, unspecified: Secondary | ICD-10-CM

## 2023-02-02 DIAGNOSIS — R3129 Other microscopic hematuria: Secondary | ICD-10-CM | POA: Diagnosis not present

## 2023-02-02 LAB — MICROSCOPIC EXAMINATION

## 2023-02-02 LAB — URINALYSIS, COMPLETE
Bilirubin, UA: NEGATIVE
Glucose, UA: NEGATIVE
Ketones, UA: NEGATIVE
Nitrite, UA: NEGATIVE
Protein,UA: NEGATIVE
Specific Gravity, UA: <1.005 — ABNORMAL LOW (ref 1.005–1.030)
Urobilinogen, Ur: 0.2 mg/dL (ref 0.2–1.0)
pH, UA: 5.5 (ref 5.0–7.5)

## 2023-02-02 NOTE — Progress Notes (Signed)
Carrie Nichols,acting as a scribe for Riki Altes, MD., have documented all relevant documentation on the behalf of Riki Altes, MD, as directed by  Riki Altes, MD while in the presence of Riki Altes, MD.   02/02/2023 4:21 PM   Carrie Nichols Jan 29, 1966 [id number]  Referring provider: Shane Crutch, PA [address] [place],  [place] [address]  Chief Complaint  Patient presents with   Hematuria    HPI: Carrie Nichols is a 57 y.o. female presenting for evaluation of hematuria.  Forwarded notes did not include any UA results. Seen by PCP approximately one month ago with UTI symptoms and treated for same. Had resolution of symptoms and UA showed persistent blood, however, it sounds like it was a dipstick urine. Prior tobacco history. Denies flank, abdominal, or pelvic pain. Denies gross hematuria.   PMH: Past Medical History:  Diagnosis Date   Dysrhythmia    SVT   Mitral valve prolapse    MRSA infection    Tachycardia     Surgical History: Past Surgical History:  Procedure Laterality Date   COLONOSCOPY WITH PROPOFOL N/A 08/15/2017   Procedure: COLONOSCOPY WITH PROPOFOL;  Surgeon: Christena Deem, MD;  Location: [facility] ENDOSCOPY;  Service: Endoscopy;  Laterality: N/A;    Home Medications:  Allergies as of 02/02/2023       Reactions   Covid-19 (mrna) Vaccine Other (See Comments)   Spike in BP, numbness tingling in mouth, difficulty seeing   Codeine Rash        Medication List        Accurate as of Feb 02, 2023  4:21 PM. If you have any questions, ask your nurse or doctor.          STOP taking these medications    cephALEXin 500 MG capsule Commonly known as: KEFLEX Stopped by: Riki Altes, MD       TAKE these medications    acetaminophen 500 MG tablet Commonly known as: TYLENOL Take 500 mg by mouth every 6 (six) hours as needed.   amLODipine 5 MG tablet Commonly known as: NORVASC Take 5 mg by mouth  daily.   metoprolol tartrate 50 MG tablet Commonly known as: LOPRESSOR Take 50 mg by mouth 2 (two) times daily.        Allergies:  Allergies  Allergen Reactions   Covid-19 (Mrna) Vaccine Other (See Comments)    Spike in BP, numbness tingling in mouth, difficulty seeing   Codeine Rash    Family History: Family History  Problem Relation Age of Onset   Hyperlipidemia Mother    High blood pressure Mother    Skin cancer Father    Colon polyps Father    Hyperlipidemia Brother    Ovarian cancer Paternal Grandmother    Prostate cancer Paternal Grandfather    Colon cancer Paternal Uncle    Breast cancer Neg Hx     Social History:  reports that she has never smoked. She has never used smokeless tobacco. She reports that she does not drink alcohol and does not use drugs.   Physical Exam: BP 133/81   Pulse 73   Ht 5\' 4"  (1.626 m)   Wt 160 lb (72.6 kg)   BMI 27.46 kg/m   Constitutional:  Alert and oriented, No acute distress. HEENT: [place] AT Respiratory: Normal respiratory effort, no increased work of breathing. Psychiatric: Normal mood and affect.  Laboratory Data:  Urinalysis Dipstick trace blood, microscopy 3-10 RBC.  Assessment & Plan:    1. Asymptomatic microhematuria AUA hematuria risk stratification: intermediate. We discussed the recommended evaluation for intermediate risk hematuria, which consists of a renal ultrasound and cystoscopy. Both procedures were discussed and she has elected to proceed with further evaluation. Ultrasound order place and cystoscopy scheduled.   I have reviewed the above documentation for accuracy and completeness, and I agree with the above.   Riki Altes, MD  [facility] [address], [address] [place], [place] [address] [phone]

## 2023-02-04 ENCOUNTER — Encounter: Payer: Self-pay | Admitting: Urology

## 2023-02-06 DIAGNOSIS — Z1211 Encounter for screening for malignant neoplasm of colon: Secondary | ICD-10-CM | POA: Diagnosis not present

## 2023-02-09 ENCOUNTER — Ambulatory Visit
Admission: RE | Admit: 2023-02-09 | Discharge: 2023-02-09 | Disposition: A | Discharge status: Home / Self Care | Payer: 59 | Source: Ambulatory Visit | Attending: Urology | Admitting: Urology

## 2023-02-09 DIAGNOSIS — R3129 Other microscopic hematuria: Secondary | ICD-10-CM | POA: Insufficient documentation

## 2023-02-10 ENCOUNTER — Telehealth: Payer: Self-pay

## 2023-02-10 ENCOUNTER — Encounter: Payer: Self-pay | Admitting: Urology

## 2023-02-10 NOTE — Telephone Encounter (Signed)
Pt LM on triage line requesting results of U/S, provider had already sent results to pt via mychart. Pt has seen message and responded.

## 2023-02-24 DIAGNOSIS — E785 Hyperlipidemia, unspecified: Secondary | ICD-10-CM | POA: Diagnosis not present

## 2023-02-24 DIAGNOSIS — I10 Essential (primary) hypertension: Secondary | ICD-10-CM | POA: Diagnosis not present

## 2023-02-24 DIAGNOSIS — E781 Pure hyperglyceridemia: Secondary | ICD-10-CM | POA: Diagnosis not present

## 2023-02-24 DIAGNOSIS — I491 Atrial premature depolarization: Secondary | ICD-10-CM | POA: Diagnosis not present

## 2023-02-24 DIAGNOSIS — I471 Supraventricular tachycardia, unspecified: Secondary | ICD-10-CM | POA: Diagnosis not present

## 2023-03-09 ENCOUNTER — Encounter: Payer: Self-pay | Admitting: Urology

## 2023-03-10 ENCOUNTER — Other Ambulatory Visit: Payer: 59 | Admitting: Urology

## 2023-03-28 DIAGNOSIS — E781 Pure hyperglyceridemia: Secondary | ICD-10-CM | POA: Diagnosis not present

## 2023-03-28 DIAGNOSIS — I491 Atrial premature depolarization: Secondary | ICD-10-CM | POA: Diagnosis not present

## 2023-03-28 DIAGNOSIS — E785 Hyperlipidemia, unspecified: Secondary | ICD-10-CM | POA: Diagnosis not present

## 2023-03-28 DIAGNOSIS — I10 Essential (primary) hypertension: Secondary | ICD-10-CM | POA: Diagnosis not present

## 2023-03-28 DIAGNOSIS — R002 Palpitations: Secondary | ICD-10-CM | POA: Diagnosis not present

## 2023-03-28 DIAGNOSIS — I471 Supraventricular tachycardia, unspecified: Secondary | ICD-10-CM | POA: Diagnosis not present

## 2023-03-30 ENCOUNTER — Other Ambulatory Visit: Payer: 59 | Admitting: Urology

## 2023-03-30 ENCOUNTER — Encounter: Payer: Self-pay | Admitting: Urology

## 2023-04-08 DIAGNOSIS — R002 Palpitations: Secondary | ICD-10-CM | POA: Diagnosis not present

## 2023-04-14 ENCOUNTER — Encounter: Payer: Self-pay | Admitting: Urology

## 2023-04-15 ENCOUNTER — Other Ambulatory Visit: Payer: 59 | Admitting: Urology

## 2023-04-29 ENCOUNTER — Encounter: Payer: Self-pay | Admitting: *Deleted

## 2023-05-09 ENCOUNTER — Encounter: Admission: RE | Payer: Self-pay | Source: Ambulatory Visit

## 2023-05-09 ENCOUNTER — Ambulatory Visit: Admission: RE | Admit: 2023-05-09 | Payer: No Typology Code available for payment source | Source: Ambulatory Visit

## 2023-05-09 HISTORY — DX: Other specified abnormal immunological findings in serum: R76.8

## 2023-05-09 HISTORY — DX: Supraventricular tachycardia, unspecified: I47.10

## 2023-05-09 HISTORY — DX: Unspecified osteoarthritis, unspecified site: M19.90

## 2023-05-09 HISTORY — DX: Essential (primary) hypertension: I10

## 2023-05-09 HISTORY — DX: Other specified abnormal immunological findings in serum: R76.89

## 2023-05-09 SURGERY — COLONOSCOPY WITH PROPOFOL
Anesthesia: General

## 2023-05-11 ENCOUNTER — Telehealth: Payer: Self-pay | Admitting: Physician Assistant

## 2023-05-11 DIAGNOSIS — R3989 Other symptoms and signs involving the genitourinary system: Secondary | ICD-10-CM

## 2023-05-11 MED ORDER — CEPHALEXIN 500 MG PO CAPS: 500.0000 mg | ORAL_CAPSULE | Freq: Two times a day (BID) | ORAL | 0 refills | Status: DC

## 2023-05-11 NOTE — Progress Notes (Signed)

## 2023-05-30 ENCOUNTER — Ambulatory Visit: Payer: Self-pay | Admitting: Cardiology

## 2023-06-14 ENCOUNTER — Other Ambulatory Visit: Payer: Self-pay | Admitting: Family Medicine

## 2023-06-14 DIAGNOSIS — Z1231 Encounter for screening mammogram for malignant neoplasm of breast: Secondary | ICD-10-CM

## 2023-08-01 ENCOUNTER — Ambulatory Visit
Admission: RE | Admit: 2023-08-01 | Discharge: 2023-08-01 | Disposition: A | Discharge status: Home / Self Care | Payer: Managed Care, Other (non HMO) | Source: Ambulatory Visit | Attending: Family Medicine | Admitting: Family Medicine

## 2023-08-01 DIAGNOSIS — Z1231 Encounter for screening mammogram for malignant neoplasm of breast: Secondary | ICD-10-CM | POA: Insufficient documentation

## 2023-08-04 ENCOUNTER — Other Ambulatory Visit: Payer: 59 | Admitting: Urology

## 2023-11-10 ENCOUNTER — Telehealth: Payer: Managed Care, Other (non HMO) | Admitting: Physician Assistant

## 2023-11-10 DIAGNOSIS — J019 Acute sinusitis, unspecified: Secondary | ICD-10-CM

## 2023-11-10 DIAGNOSIS — B9689 Other specified bacterial agents as the cause of diseases classified elsewhere: Secondary | ICD-10-CM

## 2023-11-10 MED ORDER — AMOXICILLIN-POT CLAVULANATE 875-125 MG PO TABS: 1.0000 | ORAL_TABLET | Freq: Two times a day (BID) | ORAL | 0 refills | Status: DC

## 2023-11-10 NOTE — Progress Notes (Signed)

## 2023-11-10 NOTE — Progress Notes (Signed)
 I have spent 5 minutes in review of e-visit questionnaire, review and updating patient chart, medical decision making and response to patient.   Piedad Climes, PA-C

## 2024-01-21 ENCOUNTER — Telehealth: Admitting: Nurse Practitioner

## 2024-01-21 DIAGNOSIS — B9689 Other specified bacterial agents as the cause of diseases classified elsewhere: Secondary | ICD-10-CM

## 2024-01-21 DIAGNOSIS — J019 Acute sinusitis, unspecified: Secondary | ICD-10-CM | POA: Diagnosis not present

## 2024-01-21 MED ORDER — AMOXICILLIN-POT CLAVULANATE 875-125 MG PO TABS: 1.0000 | ORAL_TABLET | Freq: Two times a day (BID) | ORAL | 0 refills | Status: AC

## 2024-01-21 NOTE — Progress Notes (Signed)

## 2024-01-21 NOTE — Progress Notes (Signed)
 I have spent 5 minutes in review of e-visit questionnaire, review and updating patient chart, medical decision making and response to patient.   Claiborne Rigg, NP

## 2024-06-05 ENCOUNTER — Inpatient Hospital Stay: Attending: Oncology | Admitting: Oncology

## 2024-06-05 ENCOUNTER — Other Ambulatory Visit

## 2024-06-05 ENCOUNTER — Encounter: Payer: Self-pay | Admitting: Oncology

## 2024-06-05 VITALS — BP 127/78 | HR 58 | Temp 97.8°F | Resp 16 | Wt 146.0 lb

## 2024-06-05 DIAGNOSIS — Z800 Family history of malignant neoplasm of digestive organs: Secondary | ICD-10-CM | POA: Insufficient documentation

## 2024-06-05 DIAGNOSIS — Z8042 Family history of malignant neoplasm of prostate: Secondary | ICD-10-CM | POA: Diagnosis not present

## 2024-06-05 DIAGNOSIS — Z808 Family history of malignant neoplasm of other organs or systems: Secondary | ICD-10-CM | POA: Diagnosis not present

## 2024-06-05 DIAGNOSIS — Z807 Family history of other malignant neoplasms of lymphoid, hematopoietic and related tissues: Secondary | ICD-10-CM | POA: Diagnosis not present

## 2024-06-05 DIAGNOSIS — R591 Generalized enlarged lymph nodes: Secondary | ICD-10-CM

## 2024-06-05 DIAGNOSIS — Z83719 Family history of colon polyps, unspecified: Secondary | ICD-10-CM | POA: Diagnosis not present

## 2024-06-05 DIAGNOSIS — Z8041 Family history of malignant neoplasm of ovary: Secondary | ICD-10-CM | POA: Insufficient documentation

## 2024-06-05 DIAGNOSIS — R221 Localized swelling, mass and lump, neck: Secondary | ICD-10-CM | POA: Diagnosis not present

## 2024-06-05 NOTE — Progress Notes (Signed)
 New patient referred by Dr Buren for Lymphadenopathy.

## 2024-06-05 NOTE — Progress Notes (Signed)
 Hematology/Oncology Consult note [facility] [phone] [phone]  Patient Care Team: Donnie Handing, [place] as PCP - General (Family Medicine) Hester Wolm PARAS, MD as Consulting Physician (Cardiology) Jason Norleen Don, MD as Referring Physician (Dermatology)   Name of the patient: Carrie Nichols  [id number]  24-May-1966    Reason for referral-lymphadenopathy   Referring physician-Dr. Handing Pounds  Date of visit: 06/05/24   History of presenting illness-patient is a 58 year old female who reports having on and off right neck swelling since the last 3 to 4 years which she is concerned is a possible lymph node.  She has never had any imaging to look at this area.  She denies any changes in her appetite or weight.  Denies any lumps or bumps elsewhere.  Her father had lymphoma.  ECOG PS- 1  Pain scale- 0   Review of systems- Review of Systems  Constitutional:  Negative for chills, fever, malaise/fatigue and weight loss.  HENT:  Negative for congestion, ear discharge and nosebleeds.   Eyes:  Negative for blurred vision.  Respiratory:  Negative for cough, hemoptysis, sputum production, shortness of breath and wheezing.   Cardiovascular:  Negative for chest pain, palpitations, orthopnea and claudication.  Gastrointestinal:  Negative for abdominal pain, blood in stool, constipation, diarrhea, heartburn, melena, nausea and vomiting.  Genitourinary:  Negative for dysuria, flank pain, frequency, hematuria and urgency.  Musculoskeletal:  Negative for back pain, joint pain and myalgias.  Skin:  Negative for rash.  Neurological:  Negative for dizziness, tingling, focal weakness, seizures, weakness and headaches.  Endo/Heme/Allergies:  Does not bruise/bleed easily.  Psychiatric/Behavioral:  Negative for depression and suicidal ideas. The patient does not have insomnia.     Allergies  Allergen Reactions   Covid-19 (Mrna) Vaccine Other (See  Comments)    Spike in BP, numbness tingling in mouth, difficulty seeing   Codeine Rash    Patient Active Problem List   Diagnosis Date Noted   Primary hypertension 09/25/2020   Elevated BP without diagnosis of hypertension 08/28/2020   Other insomnia 12/05/2019   Arthritis 12/05/2019   Easy bruising 12/05/2019   Mitral valve prolapse 05/10/2016   SVT (supraventricular tachycardia) (HCC) 05/10/2016     Past Medical History:  Diagnosis Date   ANA positive    Arthritis    Dysrhythmia    SVT   Hypertension    Mitral valve prolapse    MRSA infection    SVT (supraventricular tachycardia) (HCC)    Tachycardia      Past Surgical History:  Procedure Laterality Date   COLONOSCOPY WITH PROPOFOL  N/A 08/15/2017   Procedure: COLONOSCOPY WITH PROPOFOL ;  Surgeon: Gaylyn Gladis PENNER, MD;  Location: [facility] ENDOSCOPY;  Service: Endoscopy;  Laterality: N/A;    Social History   Socioeconomic History   Marital status: Single    Spouse name: Not on file   Number of children: Not on file   Years of education: Not on file   Highest education level: Not on file  Occupational History   Not on file  Tobacco Use   Smoking status: Never   Smokeless tobacco: Never  Vaping Use   Vaping status: Never Used  Substance and Sexual Activity   Alcohol use: Not Currently   Drug use: No   Sexual activity: Yes  Other Topics Concern   Not on file  Social History Narrative   Not on file   Social Drivers of Health   Financial Resource Strain: Low Risk  (06/05/2024)  Overall Financial Resource Strain (CARDIA)    Difficulty of Paying Living Expenses: Not hard at all  Food Insecurity: No Food Insecurity (06/05/2024)   Hunger Vital Sign    Worried About Running Out of Food in the Last Year: Never true    Ran Out of Food in the Last Year: Never true  Transportation Needs: No Transportation Needs (06/05/2024)   PRAPARE - Administrator, Civil Service (Medical): No    Lack of  Transportation (Non-Medical): No  Physical Activity: Insufficiently Active (06/05/2024)   Exercise Vital Sign    Days of Exercise per Week: 3 days    Minutes of Exercise per Session: 30 min  Stress: No Stress Concern Present (06/05/2024)   [employer] of Occupational Health - Occupational Stress Questionnaire    Feeling of Stress: Not at all  Social Connections: Socially Integrated (06/05/2024)   Social Connection and Isolation Panel    Frequency of Communication with Friends and Family: More than three times a week    Frequency of Social Gatherings with Friends and Family: More than three times a week    Attends Religious Services: More than 4 times per year    Active Member of [employer] or Organizations: Yes    Attends Banker Meetings: 1 to 4 times per year    Marital Status: Married  Catering manager Violence: Not At Risk (06/05/2024)   Humiliation, Afraid, Rape, and Kick questionnaire    Fear of Current or Ex-Partner: No    Emotionally Abused: No    Physically Abused: No    Sexually Abused: No     Family History  Problem Relation Age of Onset   Hyperlipidemia Mother    High blood pressure Mother    Skin cancer Father    Colon polyps Father    Lymphoma Father    Hyperlipidemia Brother    Colon cancer Paternal Uncle    Ovarian cancer Paternal Grandmother    Prostate cancer Paternal Grandfather    Breast cancer Neg Hx      Current Outpatient Medications:    acetaminophen  (TYLENOL ) 500 MG tablet, Take 500 mg by mouth every 6 (six) hours as needed., Disp: , Rfl:    amLODipine (NORVASC) 5 MG tablet, Take 5 mg by mouth daily., Disp: , Rfl:    metoprolol tartrate (LOPRESSOR) 50 MG tablet, Take 50 mg by mouth 2 (two) times daily., Disp: , Rfl:    Physical exam:  Vitals:   06/05/24 1441  BP: 127/78  Pulse: (!) 58  Resp: 16  Temp: 97.8 F (36.6 C)  TempSrc: Tympanic  SpO2: 100%  Weight: 146 lb (66.2 kg)   Physical Exam Cardiovascular:     Rate and  Rhythm: Normal rate and regular rhythm.     Heart sounds: Normal heart sounds.  Pulmonary:     Effort: Pulmonary effort is normal.     Breath sounds: Normal breath sounds.  Abdominal:     General: Bowel sounds are normal.     Palpations: Abdomen is soft.  Lymphadenopathy:     Comments: No palpable cervical, supraclavicular, axillary or inguinal adenopathy    Skin:    General: Skin is warm and dry.  Neurological:     Mental Status: She is alert and oriented to person, place, and time.           Latest Ref Rng & Units 09/07/2021    7:30 AM  CMP  Glucose 70 - 99 mg/dL 885   BUN  6 - 20 mg/dL 12   Creatinine 9.55 - 1.00 mg/dL 9.18   Sodium 864 - 854 mmol/L 139   Potassium 3.5 - 5.1 mmol/L 4.0   Chloride 98 - 111 mmol/L 107   CO2 22 - 32 mmol/L 27   Calcium 8.9 - 10.3 mg/dL 8.7       Latest Ref Rng & Units 09/07/2021    7:30 AM  CBC  WBC 4.0 - 10.5 K/uL 3.4   Hemoglobin 12.0 - 15.0 g/dL 86.2   Hematocrit 63.9 - 46.0 % 41.0   Platelets 150 - 400 K/uL 286     Assessment and plan- Patient is a 58 y.o. female referred for concerns of possible lymphadenopathy  On my exam today I did not palpate any cervical supraclavicular axillary or inguinal adenopathy.  No palpable hepatosplenomegaly.  Given patient's concern for possible right neck swelling I am getting a ultrasound soft tissue neck for reassurance.  I will see her back after ultrasound results are back   Thank you for this kind referral and the opportunity to participate in the care of this  Patient   Visit Diagnosis 1. Lymphadenopathy     Dr. Annah Skene, MD, MPH [facility] [phone] 06/05/2024

## 2024-06-06 ENCOUNTER — Encounter: Payer: Self-pay | Admitting: Oncology

## 2024-06-13 ENCOUNTER — Ambulatory Visit
Admission: RE | Admit: 2024-06-13 | Discharge: 2024-06-13 | Disposition: A | Discharge status: Home / Self Care | Source: Ambulatory Visit | Attending: Oncology | Admitting: Oncology

## 2024-06-13 DIAGNOSIS — R591 Generalized enlarged lymph nodes: Secondary | ICD-10-CM | POA: Diagnosis present

## 2024-06-14 ENCOUNTER — Encounter: Payer: Self-pay | Admitting: Oncology

## 2024-06-14 ENCOUNTER — Telehealth: Payer: Self-pay

## 2024-06-14 NOTE — Telephone Encounter (Signed)
 The concerns noted in this message was addressed earlier today via phone call.  No further follow up needed.

## 2024-06-14 NOTE — Telephone Encounter (Signed)
 Voicemail received from patient today 06/14/24 at 10:52am calling in regard to her ultrasound soft tissue head and neck done yesterday 9/24 at 1:45PM.  Patient requesting a call back [phone].  Patient's next follow up is next week 10/3 at 2:15PM.  Spoke to patient who concerned she had a follow up appointment post US ; informed patient this is a formality and patient's first visit with Dr. Melanee was about one week ago.  Also informed patient there are no results available as of yet but report will be finalized by her next visit.  Patient verbalized understanding.

## 2024-06-14 NOTE — Telephone Encounter (Signed)
 Informed patient earlier today per telephone the US  results have not finalized and takes a few days.

## 2024-06-18 ENCOUNTER — Other Ambulatory Visit: Payer: Self-pay | Admitting: Family Medicine

## 2024-06-18 DIAGNOSIS — Z1231 Encounter for screening mammogram for malignant neoplasm of breast: Secondary | ICD-10-CM

## 2024-06-22 ENCOUNTER — Inpatient Hospital Stay: Attending: Oncology | Admitting: Oncology

## 2024-06-22 ENCOUNTER — Encounter: Payer: Self-pay | Admitting: Oncology

## 2024-06-22 VITALS — BP 125/64 | HR 72 | Temp 97.6°F | Resp 16 | Wt 149.0 lb

## 2024-06-22 DIAGNOSIS — Z79899 Other long term (current) drug therapy: Secondary | ICD-10-CM | POA: Insufficient documentation

## 2024-06-22 DIAGNOSIS — Z807 Family history of other malignant neoplasms of lymphoid, hematopoietic and related tissues: Secondary | ICD-10-CM | POA: Insufficient documentation

## 2024-06-22 DIAGNOSIS — Z8042 Family history of malignant neoplasm of prostate: Secondary | ICD-10-CM | POA: Diagnosis not present

## 2024-06-22 DIAGNOSIS — Z800 Family history of malignant neoplasm of digestive organs: Secondary | ICD-10-CM | POA: Insufficient documentation

## 2024-06-22 DIAGNOSIS — Z8041 Family history of malignant neoplasm of ovary: Secondary | ICD-10-CM | POA: Insufficient documentation

## 2024-06-22 DIAGNOSIS — Z808 Family history of malignant neoplasm of other organs or systems: Secondary | ICD-10-CM | POA: Diagnosis not present

## 2024-06-22 DIAGNOSIS — R221 Localized swelling, mass and lump, neck: Secondary | ICD-10-CM | POA: Diagnosis not present

## 2024-06-22 NOTE — Progress Notes (Signed)
 Hematology/Oncology Consult note [facility]  Telephone:([phone] Fax:([phone]  Patient Care Team: Buren Rock HERO, MD as PCP - General (Family Medicine) Hester Wolm PARAS, MD as Consulting Physician (Cardiology) Jason Norleen Don, MD as Referring Physician (Dermatology)   Name of the patient: Carrie Nichols  [id number]  October 16, 1965   Date of visit: 06/22/24  Diagnosis-referred for concerns of lymphadenopathy  Chief complaint/ Reason for visit-discuss ultrasound results and further management  Heme/Onc history: -patient is a 58 year old female who reports having on and off right neck swelling since the last 3 to 4 years which she is concerned is a possible lymph node.  She has never had any imaging to look at this area.  She denies any changes in her appetite or weight.  Denies any lumps or bumps elsewhere.  Her father had lymphoma.    Interval history-no acute changes since last visit.  Overall patient is doing well  ECOG PS- 0 Pain scale- 0   Review of systems- Review of Systems  Constitutional:  Negative for chills, fever, malaise/fatigue and weight loss.  HENT:  Negative for congestion, ear discharge and nosebleeds.   Eyes:  Negative for blurred vision.  Respiratory:  Negative for cough, hemoptysis, sputum production, shortness of breath and wheezing.   Cardiovascular:  Negative for chest pain, palpitations, orthopnea and claudication.  Gastrointestinal:  Negative for abdominal pain, blood in stool, constipation, diarrhea, heartburn, melena, nausea and vomiting.  Genitourinary:  Negative for dysuria, flank pain, frequency, hematuria and urgency.  Musculoskeletal:  Negative for back pain, joint pain and myalgias.  Skin:  Negative for rash.  Neurological:  Negative for dizziness, tingling, focal weakness, seizures, weakness and headaches.  Endo/Heme/Allergies:  Does not bruise/bleed easily.  Psychiatric/Behavioral:  Negative for  depression and suicidal ideas. The patient does not have insomnia.       Allergies  Allergen Reactions   Covid-19 (Mrna) Vaccine Other (See Comments)    Spike in BP, numbness tingling in mouth, difficulty seeing   Codeine Rash     Past Medical History:  Diagnosis Date   ANA positive    Arthritis    Dysrhythmia    SVT   Hypertension    Mitral valve prolapse    MRSA infection    SVT (supraventricular tachycardia)    Tachycardia      Past Surgical History:  Procedure Laterality Date   COLONOSCOPY WITH PROPOFOL  N/A 08/15/2017   Procedure: COLONOSCOPY WITH PROPOFOL ;  Surgeon: Gaylyn Gladis PENNER, MD;  Location: [facility] ENDOSCOPY;  Service: Endoscopy;  Laterality: N/A;    Social History   Socioeconomic History   Marital status: Single    Spouse name: Not on file   Number of children: Not on file   Years of education: Not on file   Highest education level: Not on file  Occupational History   Not on file  Tobacco Use   Smoking status: Never   Smokeless tobacco: Never  Vaping Use   Vaping status: Never Used  Substance and Sexual Activity   Alcohol use: Not Currently   Drug use: No   Sexual activity: Yes  Other Topics Concern   Not on file  Social History Narrative   Not on file   Social Drivers of Health   Financial Resource Strain: Low Risk  (06/05/2024)   Overall Financial Resource Strain (CARDIA)    Difficulty of Paying Living Expenses: Not hard at all  Food Insecurity: No Food Insecurity (06/05/2024)   Hunger Vital  Sign    Worried About Programme researcher, broadcasting/film/video in the Last Year: Never true    Ran Out of Food in the Last Year: Never true  Transportation Needs: No Transportation Needs (06/05/2024)   PRAPARE - Administrator, Civil Service (Medical): No    Lack of Transportation (Non-Medical): No  Physical Activity: Insufficiently Active (06/05/2024)   Exercise Vital Sign    Days of Exercise per Week: 3 days    Minutes of Exercise per Session: 30 min   Stress: No Stress Concern Present (06/05/2024)   [employer] of Occupational Health - Occupational Stress Questionnaire    Feeling of Stress: Not at all  Social Connections: Socially Integrated (06/05/2024)   Social Connection and Isolation Panel    Frequency of Communication with Friends and Family: More than three times a week    Frequency of Social Gatherings with Friends and Family: More than three times a week    Attends Religious Services: More than 4 times per year    Active Member of [employer] or Organizations: Yes    Attends Banker Meetings: 1 to 4 times per year    Marital Status: Married  Catering manager Violence: Not At Risk (06/05/2024)   Humiliation, Afraid, Rape, and Kick questionnaire    Fear of Current or Ex-Partner: No    Emotionally Abused: No    Physically Abused: No    Sexually Abused: No    Family History  Problem Relation Age of Onset   Hyperlipidemia Mother    High blood pressure Mother    Skin cancer Father    Colon polyps Father    Lymphoma Father    Hyperlipidemia Brother    Colon cancer Paternal Uncle    Ovarian cancer Paternal Grandmother    Prostate cancer Paternal Grandfather    Breast cancer Neg Hx      Current Outpatient Medications:    acetaminophen  (TYLENOL ) 500 MG tablet, Take 500 mg by mouth every 6 (six) hours as needed., Disp: , Rfl:    amLODipine (NORVASC) 5 MG tablet, Take 5 mg by mouth daily., Disp: , Rfl:    metoprolol tartrate (LOPRESSOR) 50 MG tablet, Take 50 mg by mouth 2 (two) times daily., Disp: , Rfl:   Physical exam:  Vitals:   06/22/24 1353  BP: 125/64  Pulse: 72  Resp: 16  Temp: 97.6 F (36.4 C)  TempSrc: Tympanic  SpO2: 99%  Weight: 149 lb (67.6 kg)   Physical Exam Cardiovascular:     Rate and Rhythm: Normal rate and regular rhythm.     Heart sounds: Normal heart sounds.  Pulmonary:     Effort: Pulmonary effort is normal.     Breath sounds: Normal breath sounds.  Skin:    General: Skin is  warm and dry.  Neurological:     Mental Status: She is alert and oriented to person, place, and time.      I have personally reviewed labs listed below:    Latest Ref Rng & Units 09/07/2021    7:30 AM  CMP  Glucose 70 - 99 mg/dL 885   BUN 6 - 20 mg/dL 12   Creatinine 9.55 - 1.00 mg/dL 9.18   Sodium 864 - 854 mmol/L 139   Potassium 3.5 - 5.1 mmol/L 4.0   Chloride 98 - 111 mmol/L 107   CO2 22 - 32 mmol/L 27   Calcium 8.9 - 10.3 mg/dL 8.7       Latest Ref Rng &  Units 09/07/2021    7:30 AM  CBC  WBC 4.0 - 10.5 K/uL 3.4   Hemoglobin 12.0 - 15.0 g/dL 86.2   Hematocrit 63.9 - 46.0 % 41.0   Platelets 150 - 400 K/uL 286    I have personally reviewed Radiology images listed below: No images are attached to the encounter.  US  SOFT TISSUE HEAD & NECK (NON-THYROID ) Result Date: 06/15/2024 CLINICAL DATA:  lymphadenopathy EXAM: ULTRASOUND OF HEAD/NECK SOFT TISSUES TECHNIQUE: Ultrasound examination of the head and neck soft tissues was performed in the area of clinical concern. COMPARISON:  Chest XR, 09/07/2021 FINDINGS: Focused ultrasound at the area of palpable concern, along the RIGHT neck. Images demonstrating a prominent RIGHT cervical lymph node, with transaxial dimension < 1 cm (0.7 cm). No additional mass or abnormal fluid collection within the imaged neck. IMPRESSION: Palpable abnormality at the RIGHT neck consistent with a prominent, non-pathologically enlarged cervical lymph node. Electronically Signed   By: Thom Hall M.D.   On: 06/15/2024 11:56     Assessment and plan- Patient is a 58 y.o. female referred for concerns of neck swelling  Patient has noticed occasional neck swelling on and off Over the last 3 to 4 years.  I did perform an ultrasound soft tissue head and neck which does not show any evidence of abnormal cervical adenopathy.  Prominent right cervical lymph node is 0.7 cm with no concerning features.  This does not require any further follow-up.  If there is any  progressive increase in her swelling in the future she could get a repeat ultrasound followed by consideration for biopsy.  No follow-up needed with me at this time   Visit Diagnosis 1. Neck swelling      Dr. Annah Skene, MD, MPH [facility] [phone] 06/22/2024 2:21 PM

## 2024-08-01 ENCOUNTER — Ambulatory Visit
Admission: RE | Admit: 2024-08-01 | Discharge: 2024-08-01 | Disposition: A | Discharge status: Home / Self Care | Source: Ambulatory Visit | Attending: Family Medicine | Admitting: Family Medicine

## 2024-08-01 DIAGNOSIS — Z1231 Encounter for screening mammogram for malignant neoplasm of breast: Secondary | ICD-10-CM | POA: Diagnosis present

## 2024-08-09 ENCOUNTER — Telehealth: Admitting: Physician Assistant

## 2024-08-09 DIAGNOSIS — B9689 Other specified bacterial agents as the cause of diseases classified elsewhere: Secondary | ICD-10-CM | POA: Diagnosis not present

## 2024-08-09 DIAGNOSIS — J019 Acute sinusitis, unspecified: Secondary | ICD-10-CM

## 2024-08-09 MED ORDER — AMOXICILLIN-POT CLAVULANATE 875-125 MG PO TABS
1.0000 | ORAL_TABLET | Freq: Two times a day (BID) | ORAL | 0 refills | Status: AC
Start: 2024-08-09 — End: ?

## 2024-08-09 NOTE — Progress Notes (Signed)
